# Patient Record
Sex: Male | Born: 1960
Health system: Southern US, Community
[De-identification: ages and names within clinical notes are randomized; demographics above are authoritative.]

## PROBLEM LIST (undated history)

## (undated) DIAGNOSIS — Z860100 Personal history of colon polyps, unspecified: Secondary | ICD-10-CM

## (undated) DIAGNOSIS — R5381 Other malaise: Secondary | ICD-10-CM

## (undated) DIAGNOSIS — F5232 Male orgasmic disorder: Secondary | ICD-10-CM

## (undated) DIAGNOSIS — N529 Male erectile dysfunction, unspecified: Secondary | ICD-10-CM

## (undated) DIAGNOSIS — N200 Calculus of kidney: Secondary | ICD-10-CM

## (undated) DIAGNOSIS — F4001 Agoraphobia with panic disorder: Secondary | ICD-10-CM

## (undated) DIAGNOSIS — M5430 Sciatica, unspecified side: Secondary | ICD-10-CM

## (undated) DIAGNOSIS — R5383 Other fatigue: Secondary | ICD-10-CM

## (undated) HISTORY — DX: Personal history of colon polyps, unspecified: Z86.0100

## (undated) HISTORY — DX: Sciatica, unspecified side: M54.30

## (undated) HISTORY — DX: Calculus of kidney: N20.0

## (undated) HISTORY — PX: LITHOTRIPSY: SUR834

## (undated) HISTORY — DX: Male orgasmic disorder: F52.32

## (undated) HISTORY — DX: Agoraphobia with panic disorder: F40.01

## (undated) HISTORY — DX: Male erectile dysfunction, unspecified: N52.9

## (undated) HISTORY — DX: Other malaise: R53.81

## (undated) HISTORY — DX: Other fatigue: R53.83

## (undated) HISTORY — DX: Personal history of colonic polyps: Z86.010

---

## 2015-05-02 ENCOUNTER — Telehealth: Payer: Self-pay | Admitting: Family Medicine

## 2015-05-02 NOTE — Telephone Encounter (Signed)
Pt wanted to know the name of the doctor that did his colonoscopy 2 years ago.  Please call

## 2015-05-03 NOTE — Telephone Encounter (Signed)
[facility]. Advised.[facility]

## 2015-05-22 ENCOUNTER — Encounter: Payer: Self-pay | Admitting: Family Medicine

## 2015-05-31 ENCOUNTER — Encounter: Payer: Self-pay | Admitting: Family Medicine

## 2015-05-31 ENCOUNTER — Ambulatory Visit (INDEPENDENT_AMBULATORY_CARE_PROVIDER_SITE_OTHER): Payer: BLUE CROSS/BLUE SHIELD | Admitting: Family Medicine

## 2015-05-31 VITALS — BP 136/86 | HR 57 | Temp 97.7°F | Resp 16 | Ht 69.5 in | Wt 194.6 lb

## 2015-05-31 DIAGNOSIS — N529 Male erectile dysfunction, unspecified: Secondary | ICD-10-CM | POA: Insufficient documentation

## 2015-05-31 DIAGNOSIS — E785 Hyperlipidemia, unspecified: Secondary | ICD-10-CM

## 2015-05-31 DIAGNOSIS — F329 Major depressive disorder, single episode, unspecified: Secondary | ICD-10-CM | POA: Diagnosis not present

## 2015-05-31 DIAGNOSIS — Z0000 Encounter for general adult medical examination without abnormal findings: Secondary | ICD-10-CM

## 2015-05-31 DIAGNOSIS — F32A Depression, unspecified: Secondary | ICD-10-CM

## 2015-05-31 DIAGNOSIS — Z7251 High risk heterosexual behavior: Secondary | ICD-10-CM | POA: Diagnosis not present

## 2015-05-31 DIAGNOSIS — K219 Gastro-esophageal reflux disease without esophagitis: Secondary | ICD-10-CM

## 2015-05-31 DIAGNOSIS — R7309 Other abnormal glucose: Secondary | ICD-10-CM

## 2015-05-31 DIAGNOSIS — R739 Hyperglycemia, unspecified: Secondary | ICD-10-CM

## 2015-05-31 DIAGNOSIS — N5201 Erectile dysfunction due to arterial insufficiency: Secondary | ICD-10-CM | POA: Diagnosis not present

## 2015-05-31 DIAGNOSIS — F418 Other specified anxiety disorders: Secondary | ICD-10-CM | POA: Insufficient documentation

## 2015-05-31 DIAGNOSIS — F419 Anxiety disorder, unspecified: Secondary | ICD-10-CM

## 2015-05-31 LAB — CBC WITH DIFFERENTIAL/PLATELET
Basophils Absolute: 0 10*3/uL (ref 0.0–0.2)
Basos: 0 %
EOS (ABSOLUTE): 0.1 10*3/uL (ref 0.0–0.4)
Eos: 1 %
Hematocrit: 47.5 % (ref 37.5–51.0)
Hemoglobin: 16.9 g/dL (ref 12.6–17.7)
Immature Grans (Abs): 0 10*3/uL (ref 0.0–0.1)
Immature Granulocytes: 0 %
Lymphocytes Absolute: 2 10*3/uL (ref 0.7–3.1)
Lymphs: 32 %
MCH: 32.8 pg (ref 26.6–33.0)
MCHC: 35.6 g/dL (ref 31.5–35.7)
MCV: 92 fL (ref 79–97)
Monocytes Absolute: 0.7 10*3/uL (ref 0.1–0.9)
Monocytes: 11 %
Neutrophils Absolute: 3.4 10*3/uL (ref 1.4–7.0)
Neutrophils: 56 %
Platelets: 266 10*3/uL (ref 150–379)
RBC: 5.16 x10E6/uL (ref 4.14–5.80)
RDW: 13.3 % (ref 12.3–15.4)
WBC: 6.2 10*3/uL (ref 3.4–10.8)

## 2015-05-31 NOTE — Progress Notes (Signed)
Name: Alec Hurst   MRN: [id number]    DOB: [birth year]   Date:05/31/2015       Progress Note  Subjective  Chief Complaint  Chief Complaint  Patient presents with  . Annual Exam    pt has no complaints today just annual PE.    HPI  Here for annual physical exam.  Has some anxiety and depression and performance anxiety, all well controlled with meds.  He desires STD testing (risky sexual behavior).  HE is gay.  Has had some elevated lipids in past and borderline sugar.    No problem-specific assessment & plan notes found for this encounter.   Past Medical History  Diagnosis Date  . Impotence, organic   . ED (erectile dysfunction)   . Hx of colonic polyps   . Reflux esophagitis   . GERD (gastroesophageal reflux disease)   . Sciatica   . Panic disorder with agoraphobia   . Malaise and fatigue   . Delayed ejaculation   . Calculus of kidney     Past Surgical History  Procedure Laterality Date  . Lithotripsy      History reviewed. No pertinent family history.  Social History   Social History  . Marital Status: Unknown    Spouse Name: N/A  . Number of Children: N/A  . Years of Education: N/A   Occupational History  . Not on file.   Social History Main Topics  . Smoking status: Never Smoker   . Smokeless tobacco: Never Used  . Alcohol Use: 0.0 oz/week    0 Standard drinks or equivalent per week     Comment: occasional  . Drug Use: No  . Sexual Activity: Not on file   Other Topics Concern  . Not on file   Social History Narrative     Current outpatient prescriptions:  .  baclofen (LIORESAL) 10 MG tablet, Take 10 mg by mouth 3 (three) times daily., Disp: , Rfl:  .  escitalopram (LEXAPRO) 10 MG tablet, Take 10 mg by mouth daily., Disp: , Rfl:  .  meloxicam (MOBIC) 15 MG tablet, Take 15 mg by mouth daily., Disp: , Rfl:  .  omeprazole (PRILOSEC) 10 MG capsule, Take 20 mg by mouth daily as needed. , Disp: , Rfl:  .  propranolol (INDERAL) 10 MG tablet, Take 10  mg by mouth 3 (three) times daily as needed. , Disp: , Rfl:  .  sildenafil (REVATIO) 20 MG tablet, Take 20 mg by mouth 3 (three) times daily., Disp: , Rfl:   Allergies  Allergen Reactions  . Eucalyptus Oil Rash  . Sulfadiazine Rash     Review of Systems  Constitutional: Negative for fever, chills, weight loss and malaise/fatigue.  HENT: Negative for hearing loss.   Eyes: Negative for blurred vision and double vision.  Respiratory: Negative for cough, hemoptysis, sputum production, shortness of breath and wheezing.   Cardiovascular: Negative for chest pain, palpitations, orthopnea and leg swelling.  Gastrointestinal: Negative for heartburn, nausea, vomiting, abdominal pain, diarrhea and blood in stool.  Genitourinary: Negative for dysuria, urgency and frequency.  Musculoskeletal: Negative for myalgias and joint pain.  Skin: Negative for rash.  Neurological: Negative for dizziness, tingling, tremors, sensory change, focal weakness, weakness and headaches.  Psychiatric/Behavioral: Positive for depression (controlled). The patient is nervous/anxious (controlled).        Objective  Filed Vitals:   05/31/15 0917  BP: 136/86  Pulse: 57  Temp: 97.7 F (36.5 C)  TempSrc: Oral  Resp: 16  Height:  5' 9.5" (1.765 m)  Weight: 194 lb 9.6 oz (88.27 kg)    Physical Exam  Constitutional: He is oriented to person, place, and time and well-developed, well-nourished, and in no distress. No distress.  HENT:  Head: Normocephalic and atraumatic.  Right Ear: External ear normal.  Left Ear: External ear normal.  Nose: Nose normal.  Mouth/Throat: Oropharynx is clear and moist.  Eyes: Conjunctivae and EOM are normal. Pupils are equal, round, and reactive to light. No scleral icterus.  Fundoscopic exam:      The right eye shows no arteriolar narrowing, no AV nicking, no hemorrhage and no papilledema.       The left eye shows no arteriolar narrowing, no AV nicking, no hemorrhage and no  papilledema.  Neck: Normal range of motion. Neck supple. Normal carotid pulses present. Carotid bruit is not present. No thyromegaly present.  Cardiovascular: Normal rate, regular rhythm, normal heart sounds and intact distal pulses.  Exam reveals no gallop and no friction rub.   No murmur heard. Pulmonary/Chest: Effort normal and breath sounds normal. No respiratory distress. He has no wheezes. He has no rales.  Abdominal: Soft. Bowel sounds are normal. He exhibits no distension and no mass. There is no tenderness.  Genitourinary: Penis normal. No discharge found.  Musculoskeletal: Normal range of motion. He exhibits no edema or tenderness.  Lymphadenopathy:    He has no cervical adenopathy.  Neurological: He is alert and oriented to person, place, and time. No cranial nerve deficit. Gait normal.  Skin: Skin is warm and dry. No rash noted. No erythema. No pallor.  Psychiatric: Mood, memory, affect and judgment normal.  Vitals reviewed.        No results found for this or any previous visit (from the past 2160 hour(s)).   Assessment & Plan  Problem List Items Addressed This Visit      Digestive   GERD without esophagitis     Genitourinary   ED (erectile dysfunction)     Other   Depression - Primary   Acute anxiety      Meds ordered this encounter  Medications  . sildenafil (REVATIO) 20 MG tablet    Sig: Take 20 mg by mouth 3 (three) times daily.   7.Depression   2. Acute anxiety   3. GERD without esophagitis  - CBC with Differential  4. Erectile dysfunction due to arterial insufficiency   5. Elevated lipids  - Comprehensive Metabolic Panel (CMET) - Lipid Profile  6. Elevated blood sugar  - HgB A1c  1.Annual physical exam   8. Risky sexual behavior  - Hepatitis C Antibody - HIV antibody (with reflex) - RPR

## 2015-05-31 NOTE — Patient Instructions (Signed)
Continue current meds 

## 2015-06-01 ENCOUNTER — Other Ambulatory Visit: Payer: Self-pay | Admitting: Family Medicine

## 2015-06-01 LAB — LIPID PANEL
Chol/HDL Ratio: 4.9 ratio units (ref 0.0–5.0)
Cholesterol, Total: 234 mg/dL — ABNORMAL HIGH (ref 100–199)
HDL: 48 mg/dL (ref >39)
LDL Calculated: 156 mg/dL — ABNORMAL HIGH (ref 0–99)
Triglycerides: 148 mg/dL (ref 0–149)
VLDL Cholesterol Cal: 30 mg/dL (ref 5–40)

## 2015-06-01 LAB — COMPREHENSIVE METABOLIC PANEL
ALT: 52 IU/L — ABNORMAL HIGH (ref 0–44)
AST: 30 IU/L (ref 0–40)
Albumin/Globulin Ratio: 1.8 (ref 1.1–2.5)
Albumin: 4.4 g/dL (ref 3.5–5.5)
Alkaline Phosphatase: 55 IU/L (ref 39–117)
BUN/Creatinine Ratio: 16 (ref 9–20)
BUN: 14 mg/dL (ref 6–24)
Bilirubin Total: 0.5 mg/dL (ref 0.0–1.2)
CO2: 24 mmol/L (ref 18–29)
Calcium: 9.7 mg/dL (ref 8.7–10.2)
Chloride: 102 mmol/L (ref 97–108)
Creatinine, Ser: 0.9 mg/dL (ref 0.76–1.27)
GFR calc Af Amer: 112 mL/min/{1.73_m2} (ref >59)
GFR calc non Af Amer: 97 mL/min/{1.73_m2} (ref >59)
Globulin, Total: 2.4 g/dL (ref 1.5–4.5)
Glucose: 101 mg/dL — ABNORMAL HIGH (ref 65–99)
Potassium: 5 mmol/L (ref 3.5–5.2)
Sodium: 142 mmol/L (ref 134–144)
Total Protein: 6.8 g/dL (ref 6.0–8.5)

## 2015-06-01 LAB — HEMOGLOBIN A1C: Hgb A1c MFr Bld: 5.8 % — ABNORMAL HIGH (ref 4.8–5.6)

## 2015-06-01 LAB — HIV ANTIBODY (ROUTINE TESTING W REFLEX): HIV Screen 4th Generation wRfx: NONREACTIVE

## 2015-06-01 LAB — HEPATITIS C ANTIBODY: Hep C Virus Ab: <0.1 s/co ratio (ref 0.0–0.9)

## 2015-06-01 LAB — RPR: RPR Ser Ql: NONREACTIVE

## 2015-06-01 MED ORDER — ATORVASTATIN CALCIUM 20 MG PO TABS: ORAL_TABLET | ORAL | Status: DC

## 2015-06-29 DIAGNOSIS — K625 Hemorrhage of anus and rectum: Secondary | ICD-10-CM | POA: Insufficient documentation

## 2015-06-29 DIAGNOSIS — Z86010 Personal history of colonic polyps: Secondary | ICD-10-CM | POA: Insufficient documentation

## 2015-08-02 NOTE — Telephone Encounter (Signed)
Error

## 2015-08-17 LAB — HM COLONOSCOPY

## 2015-09-04 ENCOUNTER — Ambulatory Visit: Payer: Self-pay | Admitting: Family Medicine

## 2015-09-21 ENCOUNTER — Telehealth: Payer: Self-pay | Admitting: *Deleted

## 2015-09-21 NOTE — Telephone Encounter (Signed)
R/t call to patient to find out more detail on travel vaccination request. [facility]

## 2015-09-25 ENCOUNTER — Telehealth: Payer: Self-pay | Admitting: Family Medicine

## 2015-09-25 NOTE — Telephone Encounter (Signed)
Pt  Return your  Call

## 2015-09-26 ENCOUNTER — Telehealth: Payer: Self-pay | Admitting: Family Medicine

## 2015-09-26 MED ORDER — HYDROCORTISONE ACETATE 25 MG RE SUPP: 25.0000 mg | Freq: Two times a day (BID) | RECTAL | Status: DC

## 2015-09-26 NOTE — Telephone Encounter (Signed)
Advised patient on travel vaccines at health dept in town. Also requesting refill on Anusol suppository.

## 2015-09-26 NOTE — Telephone Encounter (Signed)
Agree re: travel advice at health dept.  May refill Anusol suppositories, #12, 1 rectally twice a day x 6 days.  1 refill.-jh

## 2015-09-26 NOTE — Telephone Encounter (Signed)
Pt said the Health Dept told him the malaria and typhoid would be pills that could be called to pharmacy and he would also need a prescription of cipro to take with him on trip to [place].  He used [facility] on [address]  His call back number is [phone]

## 2015-09-26 NOTE — Telephone Encounter (Signed)
I am not sure if Typhoid med is required for this region.  Also I have not prescribed this medicine in > 15 yrs and am not sure of current recommendations.  Also the malaria medication is specific for the region that he is going to, and I  do not have the specific recommendations for [place].  I can send him in a prescription for the Cipro.  The health department of [facility] does do travel medicine and  Can give up to date info for his travel.  Sonya, if you,could get him that phone number.  Alternatively we could get him an appt. With Infectious disease at [facility] and they could do his travel recommendations.  Sorry, travel medicine can be quite complicated.

## 2015-09-26 NOTE — Telephone Encounter (Signed)
Have you heard of this being called in? I have always sent them to [facility]

## 2015-09-27 ENCOUNTER — Telehealth: Payer: Self-pay | Admitting: Family Medicine

## 2015-09-27 ENCOUNTER — Other Ambulatory Visit: Payer: Self-pay | Admitting: Family Medicine

## 2015-09-27 MED ORDER — CIPROFLOXACIN HCL 500 MG PO TABS: 500.0000 mg | ORAL_TABLET | Freq: Two times a day (BID) | ORAL | Status: DC

## 2015-09-27 NOTE — Telephone Encounter (Signed)
Cipro sent.-jh

## 2015-09-27 NOTE — Telephone Encounter (Signed)
Pt called again about having a prescription for oral typhoid and malaria set to [facility] on [address].  He also requested a prescription for Hep A shot.  His call back is [phone]

## 2015-09-27 NOTE — Telephone Encounter (Signed)
Patient made aware our office does not do travel meds. He needs to contact local health dept.

## 2015-09-27 NOTE — Telephone Encounter (Signed)
Patient given information re: travel vaccine. He says please sent Cipro rx to his pharmacy.

## 2016-01-22 DIAGNOSIS — Z125 Encounter for screening for malignant neoplasm of prostate: Secondary | ICD-10-CM | POA: Diagnosis not present

## 2016-01-22 DIAGNOSIS — N200 Calculus of kidney: Secondary | ICD-10-CM | POA: Diagnosis not present

## 2016-01-22 DIAGNOSIS — Z87448 Personal history of other diseases of urinary system: Secondary | ICD-10-CM | POA: Diagnosis not present

## 2016-01-22 DIAGNOSIS — N5203 Combined arterial insufficiency and corporo-venous occlusive erectile dysfunction: Secondary | ICD-10-CM | POA: Diagnosis not present

## 2016-02-08 ENCOUNTER — Other Ambulatory Visit: Payer: Self-pay | Admitting: Family Medicine

## 2016-02-08 MED ORDER — ESCITALOPRAM OXALATE 10 MG PO TABS: 10.0000 mg | ORAL_TABLET | Freq: Every day | ORAL | Status: DC

## 2016-02-13 ENCOUNTER — Other Ambulatory Visit: Payer: Self-pay | Admitting: Family Medicine

## 2016-02-13 MED ORDER — PROPRANOLOL HCL 10 MG PO TABS
10.0000 mg | ORAL_TABLET | Freq: Three times a day (TID) | ORAL | Status: DC | PRN
Start: 2016-02-13 — End: 2017-05-28

## 2016-05-20 ENCOUNTER — Ambulatory Visit (INDEPENDENT_AMBULATORY_CARE_PROVIDER_SITE_OTHER): Payer: BLUE CROSS/BLUE SHIELD | Admitting: Family Medicine

## 2016-05-20 ENCOUNTER — Other Ambulatory Visit: Payer: Self-pay | Admitting: Family Medicine

## 2016-05-20 ENCOUNTER — Encounter: Payer: Self-pay | Admitting: Family Medicine

## 2016-05-20 VITALS — BP 144/89 | HR 57 | Temp 98.5°F | Ht 69.5 in | Wt 200.5 lb

## 2016-05-20 DIAGNOSIS — F32A Depression, unspecified: Secondary | ICD-10-CM

## 2016-05-20 DIAGNOSIS — Z86010 Personal history of colonic polyps: Secondary | ICD-10-CM

## 2016-05-20 DIAGNOSIS — F419 Anxiety disorder, unspecified: Secondary | ICD-10-CM

## 2016-05-20 DIAGNOSIS — Z8719 Personal history of other diseases of the digestive system: Secondary | ICD-10-CM

## 2016-05-20 DIAGNOSIS — M109 Gout, unspecified: Secondary | ICD-10-CM | POA: Insufficient documentation

## 2016-05-20 DIAGNOSIS — F329 Major depressive disorder, single episode, unspecified: Secondary | ICD-10-CM

## 2016-05-20 DIAGNOSIS — M1000 Idiopathic gout, unspecified site: Secondary | ICD-10-CM

## 2016-05-20 DIAGNOSIS — R739 Hyperglycemia, unspecified: Secondary | ICD-10-CM | POA: Diagnosis not present

## 2016-05-20 DIAGNOSIS — K219 Gastro-esophageal reflux disease without esophagitis: Secondary | ICD-10-CM

## 2016-05-20 DIAGNOSIS — Z0000 Encounter for general adult medical examination without abnormal findings: Secondary | ICD-10-CM

## 2016-05-20 DIAGNOSIS — Z7251 High risk heterosexual behavior: Secondary | ICD-10-CM

## 2016-05-20 LAB — CBC WITH DIFFERENTIAL/PLATELET
Basophils Absolute: 0 cells/uL (ref 0–200)
Basophils Relative: 0 %
Eosinophils Absolute: 68 cells/uL (ref 15–500)
Eosinophils Relative: 1 %
HCT: 48.3 % (ref 38.5–50.0)
Hemoglobin: 16.9 g/dL (ref 13.2–17.1)
Lymphocytes Relative: 31 %
Lymphs Abs: 2108 cells/uL (ref 850–3900)
MCH: 33.4 pg — ABNORMAL HIGH (ref 27.0–33.0)
MCHC: 35 g/dL (ref 32.0–36.0)
MCV: 95.5 fL (ref 80.0–100.0)
MPV: 10.6 fL (ref 7.5–12.5)
Monocytes Absolute: 680 cells/uL (ref 200–950)
Monocytes Relative: 10 %
Neutro Abs: 3944 cells/uL (ref 1500–7800)
Neutrophils Relative %: 58 %
Platelets: 309 10*3/uL (ref 140–400)
RBC: 5.06 MIL/uL (ref 4.20–5.80)
RDW: 13.2 % (ref 11.0–15.0)
WBC: 6.8 10*3/uL (ref 3.8–10.8)

## 2016-05-20 MED ORDER — COLCHICINE 0.6 MG PO TABS: ORAL_TABLET | ORAL | 12 refills | Status: DC

## 2016-05-20 MED ORDER — ESCITALOPRAM OXALATE 5 MG PO TABS: 5.0000 mg | ORAL_TABLET | Freq: Every day | ORAL | 3 refills | Status: DC

## 2016-05-20 NOTE — Progress Notes (Signed)
Name: Alec Hurst   MRN: [id number]    DOB: 10-02-1961   Date:05/20/2016       Progress Note  Subjective  Chief Complaint  Chief Complaint  Patient presents with  . Annual Exam    HPI Here for complete annual physical exam.  Concerned re: STDs.  He has had colonoscopy and had 2 benign polyps.  To have another in 5 years.  He has eye lesion that is to be biopsied in 21 days.  He has had 3 gout flairs in R great toe over past 3 months.  Treated with NSAIDs but slow to resolve.  No problem-specific Assessment & Plan notes found for this encounter.   Past Medical History:  Diagnosis Date  . Calculus of kidney   . Delayed ejaculation   . ED (erectile dysfunction)   . GERD (gastroesophageal reflux disease)   . Hx of colonic polyps   . Impotence, organic   . Malaise and fatigue   . Panic disorder with agoraphobia   . Reflux esophagitis   . Sciatica     Past Surgical History:  Procedure Laterality Date  . LITHOTRIPSY      Family History  Problem Relation Age of Onset  . Colon cancer Maternal Aunt     Social History   Social History  . Marital status: Unknown    Spouse name: N/A  . Number of children: N/A  . Years of education: N/A   Occupational History  . Not on file.   Social History Main Topics  . Smoking status: Never Smoker  . Smokeless tobacco: Never Used  . Alcohol use 0.0 oz/week     Comment: occasional  . Drug use: No  . Sexual activity: Not on file   Other Topics Concern  . Not on file   Social History Narrative  . No narrative on file     Current Outpatient Prescriptions:  .  baclofen (LIORESAL) 10 MG tablet, Take 10 mg by mouth 3 (three) times daily as needed. , Disp: , Rfl:  .  ciprofloxacin (CIPRO) 500 MG tablet, Take 1 tablet (500 mg total) by mouth 2 (two) times daily. (Patient taking differently: Take 500 mg by mouth 2 (two) times daily as needed. ), Disp: 20 tablet, Rfl: 0 .  escitalopram (LEXAPRO) 5 MG tablet, Take 1 tablet (5 mg  total) by mouth daily., Disp: 90 tablet, Rfl: 3 .  hydrocortisone (ANUSOL-HC) 25 MG suppository, Place 1 suppository (25 mg total) rectally 2 (two) times daily., Disp: 12 suppository, Rfl: 1 .  meloxicam (MOBIC) 15 MG tablet, Take 15 mg by mouth daily as needed. , Disp: , Rfl:  .  omeprazole (PRILOSEC) 10 MG capsule, Take 20 mg by mouth daily as needed. , Disp: , Rfl:  .  propranolol (INDERAL) 10 MG tablet, Take 1 tablet (10 mg total) by mouth 3 (three) times daily as needed., Disp: 270 tablet, Rfl: 3 .  sildenafil (REVATIO) 20 MG tablet, Take 20 mg by mouth 3 (three) times daily., Disp: , Rfl:  .  colchicine 0.6 MG tablet, Take 2 tablets stat at beginning of gout flair, 1, 2 hours later, then 1 twice a day until flair resolves., Disp: 60 tablet, Rfl: 12  Allergies  Allergen Reactions  . Eucalyptus Oil Rash  . Sulfadiazine Rash     Review of Systems  Constitutional: Negative for chills, fever, malaise/fatigue and weight loss.  HENT: Negative for hearing loss.   Eyes: Negative for blurred vision and double vision.  Respiratory: Negative for cough, shortness of breath and wheezing.   Cardiovascular: Negative for chest pain, palpitations and leg swelling.  Gastrointestinal: Negative for abdominal pain, blood in stool and heartburn.  Genitourinary: Negative for dysuria, frequency and urgency.  Musculoskeletal: Positive for joint pain (gout flairs). Negative for back pain and myalgias.  Skin: Negative for rash.  Neurological: Negative for dizziness, tremors, weakness and headaches.      Objective  Vitals:   05/20/16 0908  BP: (!) 144/89  Pulse: (!) 57  Temp: 98.5 F (36.9 C)  TempSrc: Oral  Weight: 200 lb 8 oz (90.9 kg)  Height: 5' 9.5" (1.765 m)    Physical Exam  Constitutional: He is oriented to person, place, and time and well-developed, well-nourished, and in no distress. No distress.  HENT:  Head: Normocephalic and atraumatic.  Eyes: Conjunctivae and EOM are normal.  Pupils are equal, round, and reactive to light. No scleral icterus.  Neck: Normal range of motion. Neck supple. Carotid bruit is not present. No thyromegaly present.  Cardiovascular: Normal rate, regular rhythm and normal heart sounds.  Exam reveals no gallop and no friction rub.   No murmur heard. Pulmonary/Chest: Effort normal and breath sounds normal. No respiratory distress. He has no wheezes. He has no rales.  Abdominal: Bowel sounds are normal. He exhibits no distension and no mass. There is no tenderness.  Musculoskeletal: He exhibits no edema.  Lymphadenopathy:    He has no cervical adenopathy.  Neurological: He is alert and oriented to person, place, and time.  Skin: Skin is warm and dry.  Psychiatric: Mood, memory, affect and judgment normal.  Vitals reviewed.      No results found for this or any previous visit (from the past 2160 hour(s)).   Assessment & Plan  Problem List Items Addressed This Visit      Digestive   GERD without esophagitis     Other   Depression   Relevant Medications   escitalopram (LEXAPRO) 5 MG tablet   Performance anxiety - Primary   Relevant Medications   escitalopram (LEXAPRO) 5 MG tablet   Hx of adenomatous colonic polyps   Hx of gastroesophageal reflux (GERD)   Relevant Orders   CBC with Differential   Gout   Relevant Medications   colchicine 0.6 MG tablet   Other Relevant Orders   COMPLETE METABOLIC PANEL WITH GFR   Uric acid   Risky sexual behavior   Relevant Orders   HIV antibody (with reflex)   RPR   Hepatitis C Antibody   Health maintenance examination   Relevant Orders   Lipid Profile   PSA    Other Visit Diagnoses   None.     Meds ordered this encounter  Medications  . escitalopram (LEXAPRO) 5 MG tablet    Sig: Take 1 tablet (5 mg total) by mouth daily.    Dispense:  90 tablet    Refill:  3  . colchicine 0.6 MG tablet    Sig: Take 2 tablets stat at beginning of gout flair, 1, 2 hours later, then 1 twice a  day until flair resolves.    Dispense:  60 tablet    Refill:  12   1. Acute anxiety  - escitalopram (LEXAPRO) 5 MG tablet; Take 1 tablet (5 mg total) by mouth daily.  Dispense: 90 tablet; Refill: 3  2. Hx of gastroesophageal reflux (GERD)  - CBC with Differential  3. Depression   4. Hx of adenomatous colonic polyps   5.  Idiopathic gout, unspecified chronicity, unspecified site  - COMPLETE METABOLIC PANEL WITH GFR - Uric acid - colchicine 0.6 MG tablet; Take 2 tablets stat at beginning of gout flair, 1, 2 hours later, then 1 twice a day until flair resolves.  Dispense: 60 tablet; Refill: 12  6. Risky sexual behavior  - HIV antibody (with reflex) - RPR - Hepatitis C Antibody  7. GERD without esophagitis   8. Health maintenance examination  - Lipid Profile - PSA  Continue all routine medications

## 2016-05-21 LAB — COMPLETE METABOLIC PANEL WITH GFR
ALT: 58 U/L — ABNORMAL HIGH (ref 9–46)
AST: 35 U/L (ref 10–35)
Albumin: 4.3 g/dL (ref 3.6–5.1)
Alkaline Phosphatase: 54 U/L (ref 40–115)
BUN: 11 mg/dL (ref 7–25)
CO2: 25 mmol/L (ref 20–31)
Calcium: 9.6 mg/dL (ref 8.6–10.3)
Chloride: 104 mmol/L (ref 98–110)
Creat: 0.96 mg/dL (ref 0.70–1.33)
GFR, Est African American: >89 mL/min (ref >=60)
GFR, Est Non African American: >89 mL/min (ref >=60)
Glucose, Bld: 107 mg/dL — ABNORMAL HIGH (ref 65–99)
Potassium: 5 mmol/L (ref 3.5–5.3)
Sodium: 140 mmol/L (ref 135–146)
Total Bilirubin: 0.5 mg/dL (ref 0.2–1.2)
Total Protein: 6.4 g/dL (ref 6.1–8.1)

## 2016-05-21 LAB — RPR

## 2016-05-21 LAB — HEMOGLOBIN A1C
Hgb A1c MFr Bld: 5.8 % — ABNORMAL HIGH (ref <5.7)
Mean Plasma Glucose: 120 mg/dL

## 2016-05-21 LAB — HIV ANTIBODY (ROUTINE TESTING W REFLEX): HIV 1&2 Ab, 4th Generation: NONREACTIVE

## 2016-05-21 LAB — LIPID PANEL
Cholesterol: 223 mg/dL — ABNORMAL HIGH (ref 125–200)
HDL: 45 mg/dL (ref >=40)
LDL Cholesterol: 153 mg/dL — ABNORMAL HIGH (ref <130)
Total CHOL/HDL Ratio: 5 Ratio (ref <=5.0)
Triglycerides: 125 mg/dL (ref <150)
VLDL: 25 mg/dL (ref <30)

## 2016-05-21 LAB — PSA: PSA: 0.69 ng/mL (ref <=4.00)

## 2016-05-21 LAB — HEPATITIS C ANTIBODY: HCV Ab: NEGATIVE

## 2016-05-21 LAB — URIC ACID: Uric Acid, Serum: 8.1 mg/dL — ABNORMAL HIGH (ref 4.0–8.0)

## 2016-05-22 DIAGNOSIS — D3132 Benign neoplasm of left choroid: Secondary | ICD-10-CM | POA: Diagnosis not present

## 2016-07-23 ENCOUNTER — Other Ambulatory Visit: Payer: Self-pay | Admitting: Family Medicine

## 2016-08-26 ENCOUNTER — Ambulatory Visit: Payer: BLUE CROSS/BLUE SHIELD | Admitting: Family Medicine

## 2016-10-12 DIAGNOSIS — I10 Essential (primary) hypertension: Secondary | ICD-10-CM | POA: Diagnosis not present

## 2016-10-12 DIAGNOSIS — F17200 Nicotine dependence, unspecified, uncomplicated: Secondary | ICD-10-CM | POA: Diagnosis not present

## 2016-10-12 DIAGNOSIS — J329 Chronic sinusitis, unspecified: Secondary | ICD-10-CM | POA: Diagnosis not present

## 2016-10-12 DIAGNOSIS — B309 Viral conjunctivitis, unspecified: Secondary | ICD-10-CM | POA: Diagnosis not present

## 2016-10-12 DIAGNOSIS — R070 Pain in throat: Secondary | ICD-10-CM | POA: Diagnosis not present

## 2016-10-12 DIAGNOSIS — B9789 Other viral agents as the cause of diseases classified elsewhere: Secondary | ICD-10-CM | POA: Diagnosis not present

## 2016-10-12 DIAGNOSIS — J00 Acute nasopharyngitis [common cold]: Secondary | ICD-10-CM | POA: Diagnosis not present

## 2016-10-12 DIAGNOSIS — R52 Pain, unspecified: Secondary | ICD-10-CM | POA: Diagnosis not present

## 2016-10-12 DIAGNOSIS — J069 Acute upper respiratory infection, unspecified: Secondary | ICD-10-CM | POA: Diagnosis not present

## 2016-11-19 DIAGNOSIS — L218 Other seborrheic dermatitis: Secondary | ICD-10-CM | POA: Diagnosis not present

## 2016-11-19 DIAGNOSIS — L578 Other skin changes due to chronic exposure to nonionizing radiation: Secondary | ICD-10-CM | POA: Diagnosis not present

## 2016-11-19 DIAGNOSIS — L111 Transient acantholytic dermatosis [Grover]: Secondary | ICD-10-CM | POA: Diagnosis not present

## 2017-01-27 DIAGNOSIS — Z125 Encounter for screening for malignant neoplasm of prostate: Secondary | ICD-10-CM | POA: Diagnosis not present

## 2017-01-27 DIAGNOSIS — Z87442 Personal history of urinary calculi: Secondary | ICD-10-CM | POA: Diagnosis not present

## 2017-01-27 DIAGNOSIS — N200 Calculus of kidney: Secondary | ICD-10-CM | POA: Diagnosis not present

## 2017-05-26 ENCOUNTER — Encounter: Payer: BLUE CROSS/BLUE SHIELD | Admitting: Family Medicine

## 2017-05-28 ENCOUNTER — Encounter: Payer: Self-pay | Admitting: Family Medicine

## 2017-05-28 ENCOUNTER — Ambulatory Visit (INDEPENDENT_AMBULATORY_CARE_PROVIDER_SITE_OTHER): Payer: BLUE CROSS/BLUE SHIELD | Admitting: Family Medicine

## 2017-05-28 VITALS — BP 138/82 | HR 54 | Temp 98.3°F | Resp 16 | Ht 69.5 in | Wt 201.2 lb

## 2017-05-28 DIAGNOSIS — Z0000 Encounter for general adult medical examination without abnormal findings: Secondary | ICD-10-CM | POA: Diagnosis not present

## 2017-05-28 DIAGNOSIS — F410 Panic disorder [episodic paroxysmal anxiety] without agoraphobia: Secondary | ICD-10-CM

## 2017-05-28 DIAGNOSIS — R7303 Prediabetes: Secondary | ICD-10-CM

## 2017-05-28 DIAGNOSIS — F418 Other specified anxiety disorders: Secondary | ICD-10-CM | POA: Diagnosis not present

## 2017-05-28 DIAGNOSIS — G8929 Other chronic pain: Secondary | ICD-10-CM

## 2017-05-28 DIAGNOSIS — Z7251 High risk heterosexual behavior: Secondary | ICD-10-CM

## 2017-05-28 DIAGNOSIS — E782 Mixed hyperlipidemia: Secondary | ICD-10-CM | POA: Insufficient documentation

## 2017-05-28 DIAGNOSIS — F411 Generalized anxiety disorder: Secondary | ICD-10-CM

## 2017-05-28 DIAGNOSIS — E1169 Type 2 diabetes mellitus with other specified complication: Secondary | ICD-10-CM | POA: Insufficient documentation

## 2017-05-28 DIAGNOSIS — Z125 Encounter for screening for malignant neoplasm of prostate: Secondary | ICD-10-CM

## 2017-05-28 DIAGNOSIS — M5442 Lumbago with sciatica, left side: Secondary | ICD-10-CM

## 2017-05-28 DIAGNOSIS — K219 Gastro-esophageal reflux disease without esophagitis: Secondary | ICD-10-CM | POA: Diagnosis not present

## 2017-05-28 DIAGNOSIS — E785 Hyperlipidemia, unspecified: Secondary | ICD-10-CM | POA: Insufficient documentation

## 2017-05-28 LAB — CBC WITH DIFFERENTIAL/PLATELET
Basophils Absolute: 60 cells/uL (ref 0–200)
Basophils Relative: 1 %
Eosinophils Absolute: 60 cells/uL (ref 15–500)
Eosinophils Relative: 1 %
HCT: 50.6 % — ABNORMAL HIGH (ref 38.5–50.0)
Hemoglobin: 17.2 g/dL — ABNORMAL HIGH (ref 13.2–17.1)
Lymphocytes Relative: 29 %
Lymphs Abs: 1740 cells/uL (ref 850–3900)
MCH: 33.1 pg — ABNORMAL HIGH (ref 27.0–33.0)
MCHC: 34 g/dL (ref 32.0–36.0)
MCV: 97.5 fL (ref 80.0–100.0)
MPV: 10.6 fL (ref 7.5–12.5)
Monocytes Absolute: 600 cells/uL (ref 200–950)
Monocytes Relative: 10 %
Neutro Abs: 3540 cells/uL (ref 1500–7800)
Neutrophils Relative %: 59 %
Platelets: 301 10*3/uL (ref 140–400)
RBC: 5.19 MIL/uL (ref 4.20–5.80)
RDW: 13.4 % (ref 11.0–15.0)
WBC: 6 10*3/uL (ref 3.8–10.8)

## 2017-05-28 MED ORDER — BACLOFEN 10 MG PO TABS: 10.0000 mg | ORAL_TABLET | Freq: Three times a day (TID) | ORAL | 2 refills | Status: DC | PRN

## 2017-05-28 MED ORDER — OMEPRAZOLE 20 MG PO CPDR: 20.0000 mg | DELAYED_RELEASE_CAPSULE | Freq: Every day | ORAL | 11 refills | Status: DC | PRN

## 2017-05-28 MED ORDER — PROPRANOLOL HCL 10 MG PO TABS: 10.0000 mg | ORAL_TABLET | Freq: Three times a day (TID) | ORAL | 3 refills | Status: DC | PRN

## 2017-05-28 NOTE — Assessment & Plan Note (Signed)
Homosexual male patient with male partner, unprotected intercourse, does not have multiple partners - He has anxiety over potential STDs and requests frequent testing, prior tests negative - Check HIV, Hepatitis Panel and RPR today by patient request, also will check Hep B vaccine status he is unsure if completed vaccine

## 2017-05-28 NOTE — Patient Instructions (Addendum)
Thank you for coming to the clinic today.  1.  Recommend to start taking Tylenol Extra Strength 500mg  tabs - take 1 to 2 tabs per dose (max 1000mg ) every 6-8 hours for pain (take regularly, don't skip a dose for next 7 days), max 24 hour daily dose is 6 tablets or 3000mg . In the future you can repeat the same everyday Tylenol course for 1-2 weeks at a time.   Start taking Baclofen (Lioresal) 10mg  (muscle relaxant) - start with half (cut) to one whole pill at night as needed for next 1-3 nights (may make you drowsy, caution with driving) see how it affects you, then if tolerated increase to one pill 2 to 3 times a day or (every 8 hours as needed)  2. For anxiety - Consider one other alternative with Buspar (Buspirone) for anxiety / Wellbutrin (Buproprion) for Depression Call if you are needing any extra help to discuss these meds or if you are ready to try one if not improving  Please schedule a Follow-up Appointment to: Return in about 1 year (around 05/28/2018) for Annual Physical.  If you have any other questions or concerns, please feel free to call the clinic or send a message through [facility]. You may also schedule an earlier appointment if necessary.  Additionally, you may be receiving a survey about your experience at our clinic within a few days to 1 week by e-mail or mail. We value your feedback.  Nobie Putnam, DO [place]

## 2017-05-28 NOTE — Assessment & Plan Note (Addendum)
Consistent with chronic GAD with panic also has some mixed depressive symptoms, multiple triggers, often anxiety related to performance (musician). Also anxiety with waiting on test results. Suspected mood/insomnia is secondary to anxiety -GAD7: 18, somewhat difficult / PHQ9: 13 somewhat - Failed SSRI due to sexual dysfunction - No prior Psych / counseling  Plan: 1. Discussion on management of anxiety - suspect he would benefit from other med management anxiety/mood - offered non SSRI options such as Buspar for anxiety and Wellbutrin for mood, but he is not ready to start new med today, will consider and can notify office 2. Continue current Propanolol 10mg  TID - seems to be stable on BB also helps any tremor or performance anxiety 3. Advised recommend therapy / counseling in future - declined today 4. Follow-up as needed if not improved anxiety, med adjust, GAD7/PHQ9

## 2017-05-28 NOTE — Assessment & Plan Note (Addendum)
Suspected chronic GERD, controlled on intermittent PPI - No GI red flag symptoms - History not suggestive of PUD - Agree with plan to use intermittent consider taper if need Refilled Omeprazole 20mg  daily PRN

## 2017-05-28 NOTE — Progress Notes (Signed)
Subjective:    Patient ID: Alec Hurst, male    DOB: 1961/02/07, 56 y.o.   MRN: [id number]  Alec Hurst is a 56 y.o. male presenting on 05/28/2017 for Annual Exam  HPI   Here for Annual Physical, patient has not established with me as new provider yet in office. He is due for fasting blood work. He lives in [place] and also receives care from other specialist there, will request records.  Pre-Diabetes Reports no significant concerns, has had "borderline" elevated sugar, with A1c 5.8 in past CBGs: Not checking CBG Meds: Never on meds Currently not on ACEi/ARB Lifestyle: - Diet (balanced diet, no particular diet plan)  - Exercise (Used to be more active with gym then had back problems and limited exercise, now recent flare up of back again)  History of Chronic Back Pain / Lumbar OA/DJD and history of Sciatica L side - Reviews prior history of Back problems with intermittent flares with underlying OA/DJD, has had episodes of L sciatica before, he was followed by Ortho in [place], received PT and dry needling (w/o relief), exercises, other treatment, and eventually cortisone shot which improved symptoms, he was to avoid high impact exercises, and he did better. Now recent flare up again over past few months, Low back and L side sciatica intermittent, he has started going to Chiropractor with partner, and will have x-rays soon - Denies active back pain or sciatica today  GERD - Reports chronic history of GERD, has been controlled on PPI with good results, not taking daily, but requesting refill  HYPERLIPIDEMIA: - Reports no concerns he states was offered statin medicine in past but he declined, not interested right now. Last lipid panel 04/2016, abnormal LDL and TC - he lost weight before and helped his labs but then has gradually gained wt back due to back problems  High Risk Sexual Behavior / MSM - He reports that he is homosexual and prefers male sex partners, has one male partner  currently, unprotected anal intercourse without condoms. No known STD exposure, but he often gets tested here yearly for HIV and RPR among other concerns including Hep C by his preference. He is also asking about PreP in future if he decides to try this. Admits his anxiety is worse if he is waiting on lab test results. Reviewed prior labs from 04/2016 negative.  History of Nephrolithiasis: - Reports prior history of kidney stone, and followed [place]rologist in [place], will send [place] copy of record. They have checked DRE there was told normal prostate. He is unsure if he has had PSA checked. No known family history of prostate cancer. 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 Also recent life stressors 2 deaths in family recently in past 12 month, aunt passed, increased his anxiety. - he is taking Propanolol now with good results, and would consider other meds, but he does not want to try meds that would cause any sexual dysfunction, has not been on Wellbutrin or Buspar before.  Health Maintenance: - History of prior colon polyps up to 13, precancerous followed by [facility] > but lives in [place] and plans to return there for GI, last colonoscopy 2017, q 2-3 years, last with 2 polyps, benign - Due for Flu Shot in  Fall 2018  Depression screen [facility] 2/9 05/28/2017 05/20/2016 05/31/2015  Decreased Interest 3 0 0  Down, Depressed, Hopeless - 0 0  PHQ - 2 Score 3 0 0  Altered sleeping 3 - -  Tired, decreased energy 3 - -  Change in appetite 2 - -  Feeling bad or failure about yourself  2 - -  Trouble concentrating 0 - -  Moving slowly or fidgety/restless 0 - -  Suicidal thoughts 0 - -  PHQ-9 Score 13 - -  Difficult doing work/chores Somewhat difficult - -    GAD 7 : Generalized Anxiety Score 05/28/2017  Nervous, Anxious, on Edge 3  Control/stop worrying 3  Worry too much - different things 3  Trouble relaxing 3  Restless 0  Easily annoyed or irritable 3  Afraid - awful might happen 3  Total GAD 7 Score 18  Anxiety Difficulty Somewhat difficult    Past Medical History:  Diagnosis Date  . Calculus of kidney   . Delayed ejaculation   . ED (erectile dysfunction)   . GERD (gastroesophageal reflux disease)   . Hx of colonic polyps   . Impotence, organic   . Malaise and fatigue   . Panic disorder with agoraphobia   . Reflux esophagitis   . Sciatica    Past Surgical History:  Procedure Laterality Date  . LITHOTRIPSY     Social History   Social History  . Marital status: Soil scientist    Spouse name: N/A  . Number of children: N/A  . Years of education: N/A   Occupational History  . Musician Copywriter, advertising, Photographer)    Social History Main Topics  . Smoking status: Never Smoker  . Smokeless tobacco: Never Used  . Alcohol use 0.0 oz/week     Comment: occasional  . Drug use: No  . Sexual activity: Not on file   Other Topics Concern  . Not on file   Social History Narrative  . No narrative on file   Family History  Problem Relation Age of Onset  . Colon cancer Maternal Aunt   . Other Mother        rx med addiction before passing   Current Outpatient Prescriptions on File Prior to Visit  Medication Sig  . colchicine 0.6 MG tablet Take 2 tablets stat at beginning of gout flair, 1, 2 hours later, then 1 twice a day until flair resolves.  . meloxicam (MOBIC) 15 MG tablet Take 15 mg by mouth daily as needed.   . sildenafil (REVATIO) 20 MG tablet Take 20 mg by mouth 3 (three) times daily.   No current facility-administered medications on file prior to visit.     Review of Systems  Constitutional: Negative for activity change, appetite change, chills, diaphoresis, fatigue, fever and unexpected weight change.   HENT: Negative for congestion, hearing loss and sinus pressure.   Eyes: Negative for visual disturbance.  Respiratory: Negative for apnea, cough, chest tightness, shortness of breath and wheezing.   Cardiovascular: Negative for chest pain, palpitations and leg swelling.  Gastrointestinal: Negative for abdominal distention, abdominal pain, anal bleeding, blood in stool, constipation, diarrhea, nausea and vomiting.  Endocrine: Negative for cold intolerance and polyuria.  Genitourinary: Negative for decreased urine volume, difficulty urinating, dysuria, frequency and hematuria.  Musculoskeletal: Positive for back pain (L sided intermittent, none today). Negative for arthralgias and neck pain.  Skin: Negative for rash.  Allergic/Immunologic: Negative for environmental allergies.  Neurological: Negative for dizziness, weakness, light-headedness, numbness and headaches.  Hematological: Negative for adenopathy.  Psychiatric/Behavioral: Negative for agitation, behavioral problems, decreased concentration, dysphoric mood, self-injury, sleep disturbance and suicidal ideas. The patient is nervous/anxious.    Per HPI unless specifically indicated above     Objective:    BP 138/82   Pulse (!) 54   Temp 98.3 F (36.8 C) (Oral)   Resp 16   Ht 5' 9.5" (1.765 m)   Wt 201 lb 3.2 oz (91.3 kg)   BMI 29.29 kg/m   Wt Readings from Last 3 Encounters:  05/28/17 201 lb 3.2 oz (91.3 kg)  05/20/16 200 lb 8 oz (90.9 kg)  05/31/15 194 lb 9.6 oz (88.3 kg)    Physical Exam  Constitutional: He is oriented to person, place, and time. He appears well-developed and well-nourished. No distress.  Well-appearing, comfortable, cooperative  HENT:  Head: Normocephalic and atraumatic.  Mouth/Throat: Oropharynx is clear and moist.  Eyes: Pupils are equal, round, and reactive to light. Conjunctivae and EOM are normal. Right eye exhibits no discharge. Left eye exhibits no discharge.  Neck: Normal range of motion. Neck  supple. No thyromegaly present.  No carotid bruits  Cardiovascular: Normal rate, regular rhythm, normal heart sounds and intact distal pulses.   No murmur heard. Pulmonary/Chest: Effort normal and breath sounds normal. No respiratory distress. He has no wheezes. He has no rales.  Abdominal: Soft. Bowel sounds are normal. He exhibits no distension and no mass. There is no tenderness.  Genitourinary:  Genitourinary Comments: Deferred DRE  Musculoskeletal: Normal range of motion. He exhibits no edema or tenderness.  Upper / Lower Extremities: - Normal muscle tone, strength bilateral upper extremities 5/5, lower extremities 5/5  Low Back Inspection: Normal appearance, no spinal deformity, symmetrical. Palpation: No tenderness over spinous processes. Bilateral lumbar paraspinal muscles non-tender and without hypertonicity/spasm. ROM: Full active ROM forward flex / back extension, rotation L/R without discomfort Special Testing: Seated SLR negative for radicular pain bilaterally, mild Left leg tightness but not pain Strength: Bilateral hip flex/ext 5/5, knee flex/ext 5/5, ankle dorsiflex/plantarflex 5/5 Neurovascular: intact distal sensation to light touch  Lymphadenopathy:    He has no cervical adenopathy.  Neurological: He is alert and oriented to person, place, and time.  Distal sensation intact to light touch all extremities  Skin: Skin is warm and dry. No rash noted. He is not diaphoretic. No erythema.  Psychiatric: He has a normal mood and affect. His behavior is normal.  Well groomed, good eye contact, normal speech and thoughts. Mildly anxious appearing but overall comfortable.  Nursing note and vitals reviewed.    Results for orders placed or performed in visit on 05/28/17  COMPLETE METABOLIC PANEL WITH GFR  Result Value Ref Range   Sodium  135 - 146 mmol/L   Potassium  3.5 - 5.3 mmol/L   Chloride  98 - 110 mmol/L   CO2  20 - 32 mmol/L   Glucose, Bld  65 - 99 mg/dL   BUN  7 -  25 mg/dL   Creat  0.70 - 1.33 mg/dL   Total Bilirubin  0.2 - 1.2 mg/dL   Alkaline Phosphatase  40 - 115 U/L   AST  10 - 35 U/L   ALT  9 - 46 U/L   Total Protein  6.1 - 8.1 g/dL   Albumin  3.6 - 5.1 g/dL   Calcium  8.6 - 10.3 mg/dL   GFR, Est African American  >=60 mL/min   GFR, Est Non African American  >=60 mL/min  Lipid  panel  Result Value Ref Range   Cholesterol  <200 mg/dL   Triglycerides  <150 mg/dL   HDL  mg/dL   Total CHOL/HDL Ratio  <5.0 Ratio   VLDL  <30 mg/dL   LDL Cholesterol  <100 mg/dL  Hemoglobin A1c  Result Value Ref Range   Hgb A1c MFr Bld  <5.7 %   Mean Plasma Glucose  mg/dL  CBC with Differential/Platelet  Result Value Ref Range   WBC 6.0 3.8 - 10.8 K/uL   RBC 5.19 4.20 - 5.80 MIL/uL   Hemoglobin 17.2 (H) 13.2 - 17.1 g/dL   HCT 50.6 (H) 38.5 - 50.0 %   MCV 97.5 80.0 - 100.0 fL   MCH 33.1 (H) 27.0 - 33.0 pg   MCHC 34.0 32.0 - 36.0 g/dL   RDW 13.4 11.0 - 15.0 %   Platelets 301 140 - 400 K/uL   MPV 10.6 7.5 - 12.5 fL   Neutro Abs 3,540 1,500 - 7,800 cells/uL   Lymphs Abs 1,740 850 - 3,900 cells/uL   Monocytes Absolute 600 200 - 950 cells/uL   Eosinophils Absolute 60 15 - 500 cells/uL   Basophils Absolute 60 0 - 200 cells/uL   Neutrophils Relative % 59 %   Lymphocytes Relative 29 %   Monocytes Relative 10 %   Eosinophils Relative 1 %   Basophils Relative 1 %   Smear Review Criteria for review not met   Acute Hep Panel & Hep B Surface Ab  Result Value Ref Range   Hepatitis B Surface Ag  NON-REACTIVE   Hep B C IgM  NON-REACTIVE   Hep B S Ab  NON-REACTIVE   Hep A IgM  NON-REACTIVE   HCV Ab  NON-REACTIVE  RPR  Result Value Ref Range   RPR Ser Ql  NON REAC  HIV antibody  Result Value Ref Range   HIV 1&2 Ab, 4th Generation  NONREACTIVE  PSA, Total with Reflex to PSA, Free  Result Value Ref Range   PSA, Total  ng/mL  Hepatitis panel, acute  Result Value Ref Range   Hepatitis B Surface Ag  NON-REACTIVE   HCV Ab  NON-REACTIVE   Hep B C IgM   NON-REACTIVE   Hep A IgM  NON-REACTIVE      Assessment & Plan:   Problem List Items Addressed This Visit    Screening for prostate cancer    Check PSA today Patient has Urologist in [place], has had normal PSA in past, but requests re-check today      Relevant Orders   PSA, Total with Reflex to PSA, Free (Completed)   Pre-diabetes    Well-controlled Pre-DM with A1c 5.8 in past Concern with HLD  Plan:  1. Not on any therapy currently 2. Encourage improved lifestyle - low carb, low sugar diet, reduce portion size, continue improving regular exercise 3. Follow-up q 6-12 months A1c      Relevant Orders   COMPLETE METABOLIC PANEL WITH GFR (Completed)   Hemoglobin A1c (Completed)   Performance anxiety    Significant trigger for his general anxiety, see A&P Controlled on Propanolol      Hyperlipidemia    Uncontrolled cholesterol on lifestyle, some weight gain back Last lipid panel 04/2016 - elevated LDL  Plan: 1. Discussion on ASCVD risk reduction / control LDL - not interested in statin or ASA at this time 2. Check fasting lipid panel - calculated ASCVD risk follow-up 3. Encourage improved lifestyle - low carb/cholesterol, reduce portion size, continue  improving regular exercise 4. Follow-up 1 yr      Relevant Medications   atorvastatin (LIPITOR) 20 MG tablet   propranolol (INDERAL) 10 MG tablet   Other Relevant Orders   Lipid panel (Completed)   High risk sexual behavior    Homosexual male patient with male partner, unprotected intercourse, does not have multiple partners - He has anxiety over potential STDs and requests frequent testing, prior tests negative - Check HIV, Hepatitis Panel and RPR today by patient request, also will check Hep B vaccine status he is unsure if completed vaccine      Relevant Orders   Acute Hep Panel & Hep B Surface Ab (Completed)   RPR (Completed)   HIV antibody (Completed)   GERD without esophagitis    Suspected chronic GERD, controlled  on intermittent PPI - No GI red flag symptoms - History not suggestive of PUD - Agree with plan to use intermittent consider taper if need Refilled Omeprazole 57m daily PRN      Relevant Medications   omeprazole (PRILOSEC) 20 MG capsule   Generalized anxiety disorder with panic attacks    Consistent with chronic GAD with panic also has some mixed depressive symptoms, multiple triggers, often anxiety related to performance (musician). Also anxiety with waiting on test results. Suspected mood/insomnia is secondary to anxiety -GAD7: 18, somewhat difficult / PHQ9: 13 somewhat - Failed SSRI due to sexual dysfunction - No prior Psych / counseling  Plan: 1. Discussion on management of anxiety - suspect he would benefit from other med management anxiety/mood - offered non SSRI options such as Buspar for anxiety and Wellbutrin for mood, but he is not ready to start new med today, will consider and can notify office 2. Continue current Propanolol 124mTID - seems to be stable on BB also helps any tremor or performance anxiety 3. Advised recommend therapy / counseling in future - declined today 4. Follow-up as needed if not improved anxiety, med adjust, GAD7/PHQ9      Relevant Medications   propranolol (INDERAL) 10 MG tablet   Chronic left-sided low back pain with left-sided sciatica    Subacute on chronic L LBP with associated L sciatica. Suspect likely due to muscle spasm/strain, without known injury or trauma. In setting of known chronic LBP with DJD,  - No red flag symptoms. Negative SLR for radiculopathy - Inadequate conservative therapy   Plan: 1. Start regular Tylenol dosing 2. Refilled Baclofen PRN 3. Follow-up with Chiropractor, x-ray images 4. May consider return to [place]o in [place] interested      Relevant Medications   baclofen (LIORESAL) 10 MG tablet    Other Visit Diagnoses    Annual physical exam    -  Primary   Relevant Orders   COMPLETE METABOLIC PANEL WITH GFR  (Completed)   Lipid panel (Completed)   CBC with Differential/Platelet (Completed)      Meds ordered this encounter  Medications  . atorvastatin (LIPITOR) 20 MG tablet    Sig: atorvastatin 20 mg tablet  take 1 tablet by mouth every evening  . propranolol (INDERAL) 10 MG tablet    Sig: Take 1 tablet (10 mg total) by mouth 3 (three) times daily as needed.    Dispense:  270 tablet    Refill:  3  . omeprazole (PRILOSEC) 20 MG capsule    Sig: Take 1 capsule (20 mg total) by mouth daily as needed.    Dispense:  30 capsule    Refill:  11  .  baclofen (LIORESAL) 10 MG tablet    Sig: Take 1 tablet (10 mg total) by mouth 3 (three) times daily as needed.    Dispense:  30 each    Refill:  2     Follow up plan: Return in about 1 year (around 05/28/2018) for Annual Physical.  Nobie Putnam, DO [facility] 05/29/2017, 12:03 AM

## 2017-05-29 DIAGNOSIS — M5442 Lumbago with sciatica, left side: Secondary | ICD-10-CM

## 2017-05-29 DIAGNOSIS — G8929 Other chronic pain: Secondary | ICD-10-CM | POA: Insufficient documentation

## 2017-05-29 DIAGNOSIS — Z125 Encounter for screening for malignant neoplasm of prostate: Secondary | ICD-10-CM | POA: Insufficient documentation

## 2017-05-29 LAB — COMPLETE METABOLIC PANEL WITH GFR
ALT: 55 U/L — ABNORMAL HIGH (ref 9–46)
AST: 34 U/L (ref 10–35)
Albumin: 4.2 g/dL (ref 3.6–5.1)
Alkaline Phosphatase: 62 U/L (ref 40–115)
BUN: 11 mg/dL (ref 7–25)
CO2: 19 mmol/L — ABNORMAL LOW (ref 20–32)
Calcium: 9.5 mg/dL (ref 8.6–10.3)
Chloride: 105 mmol/L (ref 98–110)
Creat: 0.87 mg/dL (ref 0.70–1.33)
GFR, Est African American: >89 mL/min (ref >=60)
GFR, Est Non African American: >89 mL/min (ref >=60)
Glucose, Bld: 116 mg/dL — ABNORMAL HIGH (ref 65–99)
Potassium: 4.4 mmol/L (ref 3.5–5.3)
Sodium: 139 mmol/L (ref 135–146)
Total Bilirubin: 0.8 mg/dL (ref 0.2–1.2)
Total Protein: 6.6 g/dL (ref 6.1–8.1)

## 2017-05-29 LAB — ACUTE HEP PANEL AND HEP B SURFACE AB
HCV Ab: NONREACTIVE
Hep A IgM: NONREACTIVE
Hep B C IgM: 0.12
Hep B S Ab: REACTIVE — AB
Hepatitis B Surface Ag: NONREACTIVE

## 2017-05-29 LAB — HEMOGLOBIN A1C
Hgb A1c MFr Bld: 5.7 % — ABNORMAL HIGH (ref <5.7)
Mean Plasma Glucose: 117 mg/dL

## 2017-05-29 LAB — HEPATITIS PANEL, ACUTE
HCV Ab: NONREACTIVE
Hep A IgM: NONREACTIVE
Hep B C IgM: 0.12
Hepatitis B Surface Ag: NONREACTIVE

## 2017-05-29 LAB — LIPID PANEL
Cholesterol: 227 mg/dL — ABNORMAL HIGH (ref <200)
HDL: 45 mg/dL (ref >40)
LDL Cholesterol: 148 mg/dL — ABNORMAL HIGH (ref <100)
Total CHOL/HDL Ratio: 5 Ratio — ABNORMAL HIGH (ref <5.0)
Triglycerides: 170 mg/dL — ABNORMAL HIGH (ref <150)
VLDL: 34 mg/dL — ABNORMAL HIGH (ref <30)

## 2017-05-29 LAB — RPR

## 2017-05-29 LAB — PSA, TOTAL WITH REFLEX TO PSA, FREE: PSA, Total: 0.6 ng/mL (ref <=4.0)

## 2017-05-29 LAB — HIV ANTIBODY (ROUTINE TESTING W REFLEX): HIV 1&2 Ab, 4th Generation: NONREACTIVE

## 2017-05-29 NOTE — Assessment & Plan Note (Addendum)
Significant trigger for his general anxiety, see A&P Controlled on Propanolol

## 2017-05-29 NOTE — Assessment & Plan Note (Signed)
Uncontrolled cholesterol on lifestyle, some weight gain back Last lipid panel 04/2016 - elevated LDL  Plan: 1. Discussion on ASCVD risk reduction / control LDL - not interested in statin or ASA at this time 2. Check fasting lipid panel - calculated ASCVD risk follow-up 3. Encourage improved lifestyle - low carb/cholesterol, reduce portion size, continue improving regular exercise 4. Follow-up 1 yr

## 2017-05-29 NOTE — Assessment & Plan Note (Signed)
Subacute on chronic L LBP with associated L sciatica. Suspect likely due to muscle spasm/strain, without known injury or trauma. In setting of known chronic LBP with DJD,  - No red flag symptoms. Negative SLR for radiculopathy - Inadequate conservative therapy   Plan: 1. Start regular Tylenol dosing 2. Refilled Baclofen PRN 3. Follow-up with Chiropractor, x-ray images 4. May consider return to Ortho in [place] if interested

## 2017-05-29 NOTE — Assessment & Plan Note (Signed)
Check PSA today Patient has Urologist in [place], has had normal PSA in past, but requests re-check today

## 2017-05-29 NOTE — Assessment & Plan Note (Signed)
Well-controlled Pre-DM with A1c 5.8 in past Concern with HLD  Plan:  1. Not on any therapy currently 2. Encourage improved lifestyle - low carb, low sugar diet, reduce portion size, continue improving regular exercise 3. Follow-up q 6-12 months A1c

## 2017-07-01 DIAGNOSIS — D481 Neoplasm of uncertain behavior of connective and other soft tissue: Secondary | ICD-10-CM | POA: Diagnosis not present

## 2017-09-08 DIAGNOSIS — D23121 Other benign neoplasm of skin of left upper eyelid, including canthus: Secondary | ICD-10-CM | POA: Diagnosis not present

## 2017-09-08 DIAGNOSIS — D481 Neoplasm of uncertain behavior of connective and other soft tissue: Secondary | ICD-10-CM | POA: Diagnosis not present

## 2017-09-08 DIAGNOSIS — D23111 Other benign neoplasm of skin of right upper eyelid, including canthus: Secondary | ICD-10-CM | POA: Diagnosis not present

## 2018-01-26 DIAGNOSIS — Z125 Encounter for screening for malignant neoplasm of prostate: Secondary | ICD-10-CM | POA: Diagnosis not present

## 2018-01-26 DIAGNOSIS — N5203 Combined arterial insufficiency and corporo-venous occlusive erectile dysfunction: Secondary | ICD-10-CM | POA: Diagnosis not present

## 2018-01-26 DIAGNOSIS — N200 Calculus of kidney: Secondary | ICD-10-CM | POA: Diagnosis not present

## 2018-03-31 ENCOUNTER — Telehealth: Payer: Self-pay | Admitting: Nurse Practitioner

## 2018-03-31 DIAGNOSIS — Z7252 High risk homosexual behavior: Secondary | ICD-10-CM

## 2018-03-31 NOTE — Telephone Encounter (Signed)
Incoming call

## 2018-03-31 NOTE — Telephone Encounter (Signed)
Ordered routine HIV screen test as requested, this is future order, he may schedule Lab Only apt to have blood drawn for this test only - if he needs other tests needs to notify office, otherwise he should follow-up  Nobie Putnam, [facility] 03/31/2018, 5:17 PM

## 2018-03-31 NOTE — Telephone Encounter (Signed)
Pt would like to do labs to check for HIV, Thurdsay if possible.  Please call [phone]

## 2018-04-02 ENCOUNTER — Other Ambulatory Visit: Payer: BLUE CROSS/BLUE SHIELD

## 2018-04-02 DIAGNOSIS — Z7252 High risk homosexual behavior: Secondary | ICD-10-CM

## 2018-04-03 LAB — HIV ANTIBODY (ROUTINE TESTING W REFLEX): HIV 1&2 Ab, 4th Generation: NONREACTIVE

## 2018-05-04 ENCOUNTER — Telehealth: Payer: Self-pay | Admitting: Family Medicine

## 2018-05-04 DIAGNOSIS — M1000 Idiopathic gout, unspecified site: Secondary | ICD-10-CM

## 2018-05-04 MED ORDER — COLCHICINE 0.6 MG PO TABS: ORAL_TABLET | ORAL | 3 refills | Status: DC

## 2018-05-04 NOTE — Telephone Encounter (Signed)
Refilled Colchicine for PRN gout flare only. Lowered # of pills and refills to PRN use only  Sent to [place] 05/04/2018, 1:35 PM

## 2018-05-04 NOTE — Telephone Encounter (Signed)
Pt called requesting  Refill on colchicine  0.6 called into  [facility]  # [phone]....... Pt  call back # is [phone]

## 2018-05-25 ENCOUNTER — Encounter: Payer: BLUE CROSS/BLUE SHIELD | Admitting: Family Medicine

## 2018-06-01 ENCOUNTER — Encounter: Payer: BLUE CROSS/BLUE SHIELD | Admitting: Family Medicine

## 2018-06-03 ENCOUNTER — Encounter: Payer: BLUE CROSS/BLUE SHIELD | Admitting: Family Medicine

## 2018-06-25 ENCOUNTER — Encounter: Payer: BLUE CROSS/BLUE SHIELD | Admitting: Family Medicine

## 2018-07-01 ENCOUNTER — Other Ambulatory Visit: Payer: Self-pay | Admitting: Family Medicine

## 2018-07-01 ENCOUNTER — Ambulatory Visit
Admission: RE | Admit: 2018-07-01 | Discharge: 2018-07-01 | Disposition: A | Discharge status: Home / Self Care | Payer: BLUE CROSS/BLUE SHIELD | Source: Ambulatory Visit | Attending: Family Medicine | Admitting: Family Medicine

## 2018-07-01 ENCOUNTER — Ambulatory Visit (INDEPENDENT_AMBULATORY_CARE_PROVIDER_SITE_OTHER): Payer: BLUE CROSS/BLUE SHIELD | Admitting: Family Medicine

## 2018-07-01 ENCOUNTER — Encounter: Payer: Self-pay | Admitting: Family Medicine

## 2018-07-01 VITALS — BP 136/91 | HR 57 | Temp 98.1°F | Resp 16 | Ht 71.0 in | Wt 208.6 lb

## 2018-07-01 DIAGNOSIS — F411 Generalized anxiety disorder: Secondary | ICD-10-CM

## 2018-07-01 DIAGNOSIS — Z7252 High risk homosexual behavior: Secondary | ICD-10-CM

## 2018-07-01 DIAGNOSIS — B360 Pityriasis versicolor: Secondary | ICD-10-CM

## 2018-07-01 DIAGNOSIS — Z0000 Encounter for general adult medical examination without abnormal findings: Secondary | ICD-10-CM | POA: Diagnosis not present

## 2018-07-01 DIAGNOSIS — K219 Gastro-esophageal reflux disease without esophagitis: Secondary | ICD-10-CM | POA: Diagnosis not present

## 2018-07-01 DIAGNOSIS — F418 Other specified anxiety disorders: Secondary | ICD-10-CM

## 2018-07-01 DIAGNOSIS — Z125 Encounter for screening for malignant neoplasm of prostate: Secondary | ICD-10-CM

## 2018-07-01 DIAGNOSIS — M25521 Pain in right elbow: Secondary | ICD-10-CM

## 2018-07-01 DIAGNOSIS — E782 Mixed hyperlipidemia: Secondary | ICD-10-CM

## 2018-07-01 DIAGNOSIS — F410 Panic disorder [episodic paroxysmal anxiety] without agoraphobia: Secondary | ICD-10-CM

## 2018-07-01 DIAGNOSIS — M1000 Idiopathic gout, unspecified site: Secondary | ICD-10-CM

## 2018-07-01 DIAGNOSIS — R7303 Prediabetes: Secondary | ICD-10-CM

## 2018-07-01 DIAGNOSIS — M722 Plantar fascial fibromatosis: Secondary | ICD-10-CM

## 2018-07-01 MED ORDER — NAPROXEN 500 MG PO TABS: 500.0000 mg | ORAL_TABLET | Freq: Two times a day (BID) | ORAL | 2 refills | Status: DC

## 2018-07-01 MED ORDER — OMEPRAZOLE 20 MG PO CPDR: 20.0000 mg | DELAYED_RELEASE_CAPSULE | Freq: Every day | ORAL | 11 refills | Status: DC

## 2018-07-01 MED ORDER — KETOCONAZOLE 2 % EX SHAM
1.0000 | MEDICATED_SHAMPOO | Freq: Every day | CUTANEOUS | 1 refills | Status: DC | PRN
Start: 2018-07-01 — End: 2024-07-05

## 2018-07-01 MED ORDER — SUCRALFATE 1 G PO TABS: 1.0000 g | ORAL_TABLET | Freq: Three times a day (TID) | ORAL | 0 refills | Status: DC

## 2018-07-01 NOTE — Assessment & Plan Note (Signed)
Homosexual male MSM, unprotected anal intercourse Check routine HIV screen with labs Routine precautions given for safe sex

## 2018-07-01 NOTE — Assessment & Plan Note (Signed)
Suspected acute flare on chronic GERD with recent worsening due to trigger foods and intermittent med non adherence - No GI red flag symptoms. Exam is unremarkable with benign abdomen with only mild epigastric discomfort deeper palpation - Chronic history of GERD, only intermittent inadequate treatments PRN PPI - History not suggestive of PUD today but cannot rule out  Plan: 1. Refilled rx Omeprazole 20mg  daily 30 min prior to 1st meal regular dosing now, not PRN - may need to adjust in future to 40 vs 20 BID 2. Diet modifications reduce GERD 3. Also given rx Carafate PRN 4. Follow-up as needed return criteria

## 2018-07-01 NOTE — Assessment & Plan Note (Signed)
Currently stable Significant trigger for his general anxiety, see A&P Controlled on Propanolol 10mg  daily PRN - not using more frequently

## 2018-07-01 NOTE — Progress Notes (Signed)
Subjective:    Patient ID: Alec Hurst, male    DOB: Jul 23, 1961, 57 y.o.   MRN: [id number]  Alec Hurst is a 57 y.o. male presenting on 07/01/2018 for Annual Exam; Elbow Pain; Foot Pain; and Gastroesophageal Reflux   HPI   Here for Annual Physical and due for fasting labs today. Also he has several other medical complaints listed below  Pre-Diabetes Prior readings 5.7 to 5.8 CBGs: Not checking CBG Meds: Never on meds Currently not on ACEi/ARB Lifestyle: - Diet (balanced diet, no particular diet plan)  - Exercise (working on improving regular exercise. Limited by elbow and foot and back)  HYPERLIPIDEMIA: - Reports no concerns. Last lipid panel 2018, elevated readings. Due for lipids now, fasting today. - Currently taking Atorvastatin 20mg , tolerating well without side effects or myalgias  High Risk Sexual Behavior / MSM Patient is requesting updated routine HIV screen today, last negative 03/2018. He is homosexual and unprotected anal intercourse without condoms with male partner. 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  - He takes Propranolol 10mg  daily PRN for performance anxiety, otherwise not taking regularly or other meds - Currently doing well   Additional complaints today:  History of Gout Flare / Left Foot pain plantar fasciitis Last gout lvl Uric acid >8 back in 2017. He has colchicine PRN. Recently had gout flare in June 2019, he took colchicine as prescribed for up to 1-2 months with initial some improvement then less results. - he is concerned now with persistent L foot pain. More forefoot, worse in morning, feels sharp stabbing pain in Left foot forefoot and has bunion, worse with ambulating and putting direct  pressure on it, now affecting him more often, not just morning. Gradual worsening problem. Some symptoms on R side as well. 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 No prior known dx of arthritis, he is concerned about this. No prior injury or trauma. He has history of back pain and sciatica and OA/DJD of other joints - No longer taking meloxicam for his back.  GERD Reports he is taking Omeprazole 20mg  PRN mostly in evening only if certain diet high acidic foods. Recently had severe episode of heartburn, persisted for several hours, chest and upper abdominal pain, did not have any other associated symptoms, it subsided. - Today need refill Omeparzole Denies dark stool or blood in stool, nausea vomiting, chest pain, dyspnea, nausea vomiting   Dermatology [facility]) - Prior dx tinea versicolor and some seborrheic dermatitis, requested refill on Ketoconazole shampoo 2%, it was helpful before. Needs refill today.   Health Maintenance:  Due for Flu Shot, declines today despite counseling on benefits  History of prior colon polyps up to 12, precancerous followed by [facility] GI > but lives in [place] and plans to return there for GI, last colonoscopy 2017, q 2-3 years, last with 2 polyps, benign  Depression screen [facility] 2/9 07/01/2018 05/28/2017 05/20/2016  Decreased Interest 0 3 0  Down, Depressed, Hopeless 0 - 0  PHQ - 2 Score 0 3 0  Altered sleeping 0 3 -  Tired, decreased energy 0 3 -  Change in appetite 0 2 -  Feeling bad or failure about yourself  0 2 -  Trouble concentrating 0 0 -  Moving slowly or fidgety/restless 0 0 -  Suicidal thoughts 0 0 -  PHQ-9 Score 0 13 -  Difficult doing work/chores Not difficult at all Somewhat difficult -    Past Medical History:  Diagnosis Date  . Calculus of kidney     . Delayed ejaculation   . ED (erectile dysfunction)   . Hx of colonic polyps   . Impotence, organic   . Malaise and fatigue   . Panic disorder with agoraphobia   . Sciatica    Past Surgical History:  Procedure Laterality Date  . LITHOTRIPSY     Social History   Socioeconomic History  . Marital status: Soil scientist    Spouse name: Not on file  . Number of children: Not on file  . Years of education: Not on file  . Highest education level: Not on file  Occupational History  . Occupation: Musician Copywriter, advertising, Photographer)  Social Needs  . Financial resource strain: Not on file  . Food insecurity:    Worry: Not on file    Inability: Not on file  . Transportation needs:    Medical: Not on file    Non-medical: Not on file  Tobacco Use  . Smoking status: Never Smoker  . Smokeless tobacco: Never Used  Substance and Sexual Activity  . Alcohol use: Yes    Alcohol/week: 0.0 standard drinks    Comment: occasional  . Drug use: No  . Sexual activity: Not on file  Lifestyle  . Physical activity:    Days per week: Not on file    Minutes per session: Not on file  . Stress: Not on file  Relationships  . Social connections:    Talks on phone: Not on file    Gets together: Not on file    Attends religious service: Not on file    Active member of club or organization: Not on file    Attends meetings of clubs or organizations: Not on file    Relationship status: Not on file  . Intimate partner violence:    Fear of current or ex partner: Not on file    Emotionally abused: Not on file    Physically abused: Not on file    Forced sexual activity: Not on file  Other Topics Concern  . Not on file  Social History Narrative  . Not on file   Family History  Problem Relation Age of Onset  . Colon cancer Maternal Aunt   . Other Mother        rx med addiction before passing   Current Outpatient Medications on File Prior to Visit  Medication Sig  . atorvastatin (LIPITOR) 20  MG tablet atorvastatin 20 mg tablet  take 1 tablet by mouth every evening  . colchicine 0.6 MG tablet Take only if gout flare - start with 2 tablets then may take 1 tab 2 hours later, then 1 twice daily until flare resolves  . Melatonin 5 MG TABS Take 5 mg by mouth at bedtime as needed.  . sildenafil (REVATIO) 20 MG tablet Take 20 mg by mouth 3 (three) times daily.  . propranolol (INDERAL) 10 MG tablet Take 1 tablet (10 mg total) by mouth daily as needed (performance anxiety, panic).   No current facility-administered medications on file prior to visit.  Review of Systems  Constitutional: Negative for activity change, appetite change, chills, diaphoresis, fatigue and fever.  HENT: Negative for congestion and hearing loss.   Eyes: Negative for visual disturbance.  Respiratory: Negative for apnea, cough, chest tightness, shortness of breath and wheezing.   Cardiovascular: Negative for chest pain, palpitations and leg swelling.  Gastrointestinal: Negative for abdominal pain, anal bleeding, blood in stool, constipation, diarrhea, nausea and vomiting.  Endocrine: Negative for cold intolerance.  Genitourinary: Negative for decreased urine volume, difficulty urinating, dysuria, frequency and hematuria.  Musculoskeletal: Positive for arthralgias (R elbow, L foot). Negative for back pain and neck pain.  Skin: Negative for rash.  Allergic/Immunologic: Negative for environmental allergies.  Neurological: Negative for dizziness, weakness, light-headedness, numbness and headaches.  Hematological: Negative for adenopathy.  Psychiatric/Behavioral: Negative for behavioral problems, dysphoric mood and sleep disturbance. The patient is not hyperactive.    Per HPI unless specifically indicated above     Objective:    BP (!) 136/91   Pulse (!) 57   Temp 98.1 F (36.7 C) (Oral)   Resp 16   Ht 5\' 11"  (1.803 m)   Wt 208 lb 9.6 oz (94.6 kg)   BMI 29.09 kg/m   Wt Readings from Last 3 Encounters:    07/01/18 208 lb 9.6 oz (94.6 kg)  05/28/17 201 lb 3.2 oz (91.3 kg)  05/20/16 200 lb 8 oz (90.9 kg)    Physical Exam  Constitutional: He is oriented to person, place, and time. He appears well-developed and well-nourished. No distress.  Well-appearing, comfortable, cooperative  HENT:  Head: Normocephalic and atraumatic.  Mouth/Throat: Oropharynx is clear and moist.  Eyes: Pupils are equal, round, and reactive to light. Conjunctivae and EOM are normal. Right eye exhibits no discharge. Left eye exhibits no discharge.  Neck: Normal range of motion. Neck supple. No thyromegaly present.  Cardiovascular: Normal rate, regular rhythm, normal heart sounds and intact distal pulses.  No murmur heard. Pulmonary/Chest: Effort normal and breath sounds normal. No respiratory distress. He has no wheezes. He has no rales.  Abdominal: Soft. Bowel sounds are normal. He exhibits no distension and no mass. There is tenderness (epigastric discomfort).  Ventral wall some diastasis without herniation, prior umbilical hernia  Musculoskeletal: He exhibits no edema.  Upper / Lower Extremities: - Normal muscle tone, strength bilateral upper extremities 5/5, lower extremities 5/5  Right Elbow Inspection: normal appearance without edema or erythema Palpation: localized bony tenderness lateral to olecranon, not triggered over muscles forearm ROM: full active flex/exend, supination and pronation Strength: intact 5/5 Neurovascular: intact distal  Left Foot Inspection: mildly high arch pes cavus, bunion formation with reduced swelling and erythema now. Palpation: local tender forefoot 2nd/3rd toes metatarsal region ROM: full active Strength: distal intact Neurovascular: distal intact   Lymphadenopathy:    He has no cervical adenopathy.  Neurological: He is alert and oriented to person, place, and time.  Distal sensation intact to light touch all extremities  Skin: Skin is warm and dry. No rash (resolved now)  noted. He is not diaphoretic. No erythema.  Psychiatric: He has a normal mood and affect. His behavior is normal.  Well groomed, good eye contact, normal speech and thoughts  Nursing note and vitals reviewed.  I have personally reviewed the radiology report from R Elbow X-ray on 07/01/18.  CLINICAL DATA:  Three-month history of RIGHT elbow pain. No known injuries.  EXAM: RIGHT ELBOW - COMPLETE 3+ VIEW  COMPARISON:  None.  FINDINGS: No evidence of acute, subacute or healed  fractures. Incompletely united accessory ossicle for the MEDIAL epicondyle. Well-preserved joint spaces. Well-preserved bone mineral density. No POSTERIOR fat pad to indicate a joint effusion.  IMPRESSION: No significant osseous abnormality.   Electronically Signed   By: Evangeline Dakin M.D.   On: 07/01/2018 17:06  Results for orders placed or performed in visit on 04/02/18  HIV antibody  Result Value Ref Range   HIV 1&2 Ab, 4th Generation NON-REACTIVE NON-REACTI      Assessment & Plan:   Problem List Items Addressed This Visit    Generalized anxiety disorder with panic attacks    Consistent with chronic GAD with panic Seems more performance related Not endorsing depression has resolved Some history of insomnia - Failed SSRI due to sexual dysfunction - No prior Psych / counseling  Plan: 1. Continue propranolol daily PRN only  Follow-up as needed if not improved anxiety, med adjust, GAD7/PHQ9      Relevant Medications   propranolol (INDERAL) 10 MG tablet   Other Relevant Orders   COMPLETE METABOLIC PANEL WITH GFR   GERD without esophagitis    Suspected acute flare on chronic GERD with recent worsening due to trigger foods and intermittent med non adherence - No GI red flag symptoms. Exam is unremarkable with benign abdomen with only mild epigastric discomfort deeper palpation - Chronic history of GERD, only intermittent inadequate treatments PRN PPI - History not suggestive of PUD  today but cannot rule out  Plan: 1. Refilled rx Omeprazole 20mg  daily 30 min prior to 1st meal regular dosing now, not PRN - may need to adjust in future to 40 vs 20 BID 2. Diet modifications reduce GERD 3. Also given rx Carafate PRN 4. Follow-up as needed return criteria      Relevant Medications   omeprazole (PRILOSEC) 20 MG capsule   sucralfate (CARAFATE) 1 g tablet   Other Relevant Orders   CBC with Differential/Platelet   COMPLETE METABOLIC PANEL WITH GFR   Gout Prior gout flare, in foot, responded to colchicine. Now has lingering symptoms seems less likely gout. Not on prophylaxis Last Uric acid >8, 2017  Plan Check Uric acid level Stop Colchicine Start NSAID Naproxen therapeutic dose for now for other problems, and in future if needed will consider start low dose Allopurinol 100mg  daily may need prophylaxis with low dose NSAID naproxen 250 BID for period of time.    Relevant Orders   Uric acid   High risk sexual behavior    Homosexual male MSM, unprotected anal intercourse Check routine HIV screen with labs Routine precautions given for safe sex      Relevant Orders   HIV antibody   Hyperlipidemia    Prior uncontrolled lipids, now on statin due for re-check Last lipid panel 05/2017  Plan: 1. Continue Atorvastatin 20mg  daily 2. Check fasting lipid panel - calculated ASCVD risk follow-up 3. Encourage improved lifestyle - low carb/cholesterol, reduce portion size, continue improving regular exercise      Relevant Medications   propranolol (INDERAL) 10 MG tablet   Other Relevant Orders   Lipid panel   Performance anxiety    Currently stable Significant trigger for his general anxiety, see A&P Controlled on Propanolol 10mg  daily PRN - not using more frequently      Pre-diabetes    Well-controlled Pre-DM with A1c 5.7 to 5.8 in past Concern with HLD  Check A1c today with labs  Plan:  1. Not on any therapy currently 2. Encourage improved lifestyle - low  carb, low sugar  diet, reduce portion size, continue improving regular exercise 3. Follow-up q 6-12 months A1c      Relevant Orders   Hemoglobin A1c   Screening for prostate cancer   Relevant Orders   PSA    Other Visit Diagnoses    Annual physical exam    -  Primary  Updated Health Maintenance information Reviewed recent lab results with patient Encouraged improvement to lifestyle with diet and exercise    Relevant Orders   Hemoglobin A1c   CBC with Differential/Platelet   COMPLETE METABOLIC PANEL WITH GFR   Lipid panel   Tinea versicolor     Currently without flare, prior dx by Dermatology Refill Ketoconazole PRN    Relevant Medications   ketoconazole (NIZORAL) 2 % shampoo   Plantar fasciitis of left foot     Clinically consistent with subacute vs chronic L plantar fasciitis based on history however possibly exam suggestive of neuroma or metatarsalgia of other etiology - No known injury. Unlikely fracture based on history, no bony tenderness. - Limited conservative therapy  Plan: 1. Reviewed diagnosis and management 2. Start rx NSAID trial - Naproxen 500mg  BID wc x 2-4 weeks then PRN 3. May take Tylenol PRN breakthrough 4. May use topical muscle rub, ice 5. Emphasized importance of relative rest, ice, and avoid prolonged standing and overuse 6. Handout given and explained appropriate stretching and home exercises important to do first thing in morning and later 7. 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 My recommendations were likely to be tendonitis possibly lateral epicondylitis,  continue current management, NSAID, relative rest, compression RICE therapy, can use forearm strap.   Relevant Medications   naproxen (NAPROSYN) 500 MG tablet   Other Relevant Orders   DG Elbow Complete Right      Meds ordered this encounter  Medications  . ketoconazole (NIZORAL) 2 % shampoo    Sig: Apply 1 application topically daily as needed for irritation. Up to 1 week as needed for flare    Dispense:  120 mL    Refill:  1  . omeprazole (PRILOSEC) 20 MG capsule    Sig: Take 1 capsule (20 mg total) by mouth daily before breakfast.    Dispense:  30 capsule    Refill:  11  . naproxen (NAPROSYN) 500 MG tablet    Sig: Take 1 tablet (500 mg total) by mouth 2 (two) times daily with a meal. For 2-4 weeks then as needed    Dispense:  60 tablet    Refill:  2  . sucralfate (CARAFATE) 1 g tablet    Sig: Take 1 tablet (1 g total) by mouth 4 (four) times daily -  with meals and at bedtime. Only as needed for acid reflux    Dispense:  40 tablet    Refill:  0     Follow up plan: Return in about 3 months (around 09/30/2018) for plantar fascia / gout / GERD.  Nobie Putnam, [facility] 07/01/2018, 1:29 PM

## 2018-07-01 NOTE — Assessment & Plan Note (Signed)
Consistent with chronic GAD with panic Seems more performance related Not endorsing depression has resolved Some history of insomnia - Failed SSRI due to sexual dysfunction - No prior Psych / counseling  Plan: 1. Continue propranolol daily PRN only  Follow-up as needed if not improved anxiety, med adjust, GAD7/PHQ9

## 2018-07-01 NOTE — Assessment & Plan Note (Signed)
Well-controlled Pre-DM with A1c 5.7 to 5.8 in past Concern with HLD  Check A1c today with labs  Plan:  1. Not on any therapy currently 2. Encourage improved lifestyle - low carb, low sugar diet, reduce portion size, continue improving regular exercise 3. Follow-up q 6-12 months A1c

## 2018-07-01 NOTE — Patient Instructions (Addendum)
Thank you for coming to the office today.  X-ray R elbow, likely some wear and tear arthritis  Recommend trial of Anti-inflammatory with Naproxen (Naprosyn) 500mg  tabs - take one with food and plenty of water TWICE daily every day (breakfast and dinner), for next 2 to 4 weeks, then you may take only as needed - DO NOT TAKE any ibuprofen, aleve, motrin while you are taking this medicine - It is safe to take Tylenol Ext Str 500mg  tabs - take 1 to 2 (max dose 1000mg ) every 6 hours as needed for breakthrough pain, max 24 hour daily dose is 6 to 8 tablets or 4000mg   For both foot and elbow.  May try Glucosamine and Chondroitin  Labs ordered.  Last colonoscopy done by Dr Gaylyn Cheers 07/2015 - so next due in approx 2021 (total of 5 years)  May need Podiatry if foot not improving. Try exercises and medicine as advised.  Start taking Omeprazole every day before breakfast Take Sucralfate as needed with meals for acid  Please schedule a Follow-up Appointment to: Return in about 3 months (around 09/30/2018) for plantar fascia / gout / GERD.  If you have any other questions or concerns, please feel free to call the office or send a message through [place]. You may also schedule an earlier appointment if necessary.  Additionally, you may be receiving a survey about your experience at our office within a few days to 1 week by e-mail or mail. We value your feedback.  Nobie Putnam, DO [facility], [facility]              Plantar Fascia Stretches / Exercises  See other page with pictures of each exercise.  Start with 1 or 2 of these exercises that you are most comfortable with. Do not do any exercises that cause you significant worsening pain. Some of these may cause some "stretching soreness" but it should go away after you stop the exercise, and get better over time. Gradually increase up to 3-4 exercises as tolerated.  You may begin exercising the muscles of  your foot right away by gently stretching them as follows:  Stretching: Towel stretch: Sit on a hard surface with your injured leg stretched out in front of you. Loop a towel around the ball of your foot and pull the towel toward your body keeping your knee straight. Hold this position for 15 to 30 seconds then relax. Repeat 3 times. When the towel stretch becomes to easy, you may begin doing the standing calf stretch.  Standing calf stretch: Facing a wall, put your hands against the wall at about eye level. Keep the injured leg back, the uninjured leg forward, and the heel of your injured leg on the floor. Turn your injured foot slightly inward (as if you were pigeon-toed) as you slowly lean into the wall until you feel a stretch in the back of your calf. Hold for 15 to 30 seconds. Repeat 3 times. Do this exercise several times each day. When you can stand comfortably on your injured foot, you can begin stretching the bottom of your foot using the plantar fascia stretch.  Plantar fascia stretch: Stand with the ball of your injured foot on a stair. Reach for the bottom step with your heel until you feel a stretch in the arch of your foot. Hold this position for 15 to 30 seconds and then relax. Repeat 3 times. After you have stretched the bottom muscles of your foot, you can begin  strengthening the top muscles of your foot.  Frozen can roll: Roll your bare injured foot back and forth from your heel to your mid-arch over a frozen juice can. Repeat for 3 to 5 minutes. This exercise is particularly helpful if done first thing in the morning. Towel pickup: With your heel on the ground, pick up a towel with your toes. Release. Repeat 10 to 20 times. When this gets easy, add more resistance by placing a book or small weight on the towel. Static and dynamic balance exercises Place a chair next to your non-injured leg and stand upright. (This will provide you with balance if needed.) Stand on your injured  foot. Try to raise the arch of your foot while keeping your toes on the floor. Try to maintain this position and balance on your injured side for 30 seconds. This exercise can be made more difficult by doing it on a piece of foam or a pillow, or with your eyes closed. Stand in the same position as above. Keep your foot in this position and reach forward in front of you with your injured side's hand, allowing your knee to bend. Repeat this 10 times while maintaining the arch height. This exercise can be made more difficult by reaching farther in front of you. Do 2 sets. Stand in the same position as above. While maintaining your arch height, reach the injured side's hand across your body toward the chair. The farther you reach, the more challenging the exercise. Do 2 sets of 10.  Next, you can begin strengthening the muscles of your foot and lower leg by using elastic tubing.  Strengthening: Resisted dorsiflexion: Sit with your injured leg out straight and your foot facing a doorway. Tie a loop in one end of the tubing. Put your foot through the loop so that the tubing goes around the arch of your foot. Tie a knot in the other end of the tubing and shut the knot in the door. Move backward until there is tension in the tubing. Keeping your knee straight, pull your foot toward your body, stretching the tubing. Slowly return to the starting position. Do 3 sets of 10. Resisted plantar flexion: Sit with your leg outstretched and loop the middle section of the tubing around the ball of your foot. Hold the ends of the tubing in both hands. Gently press the ball of your foot down and point your toes, stretching the tubing. Return to the starting position. Do 3 sets of 10. Resisted inversion: Sit with your legs out straight and cross your uninjured leg over your injured ankle. Wrap the tubing around the ball of your injured foot and then loop it around your uninjured foot so that the tubing is anchored there at one  end. Hold the other end of the tubing in your hand. Turn your injured foot inward and upward. This will stretch the tubing. Return to the starting position. Do 3 sets of 10. Resisted eversion: Sit with both legs stretched out in front of you, with your feet about a shoulder's width apart. Tie a loop in one end of the tubing. Put your injured foot through the loop so that the tubing goes around the arch of that foot and wraps around the outside of the uninjured foot. Hold onto the other end of the tubing with your hand to provide tension. Turn your injured foot up and out. Make sure you keep your uninjured foot still so that it will allow the tubing to stretch  as you move your injured foot. Return to the starting position. Do 3 sets of 10.

## 2018-07-01 NOTE — Assessment & Plan Note (Signed)
Prior uncontrolled lipids, now on statin due for re-check Last lipid panel 05/2017  Plan: 1. Continue Atorvastatin 20mg  daily 2. Check fasting lipid panel - calculated ASCVD risk follow-up 3. Encourage improved lifestyle - low carb/cholesterol, reduce portion size, continue improving regular exercise

## 2018-07-02 ENCOUNTER — Other Ambulatory Visit: Payer: Self-pay | Admitting: Family Medicine

## 2018-07-02 DIAGNOSIS — R7989 Other specified abnormal findings of blood chemistry: Secondary | ICD-10-CM

## 2018-07-02 DIAGNOSIS — M1000 Idiopathic gout, unspecified site: Secondary | ICD-10-CM

## 2018-07-02 DIAGNOSIS — R945 Abnormal results of liver function studies: Secondary | ICD-10-CM

## 2018-07-02 DIAGNOSIS — R7303 Prediabetes: Secondary | ICD-10-CM

## 2018-07-02 DIAGNOSIS — E782 Mixed hyperlipidemia: Secondary | ICD-10-CM

## 2018-07-02 LAB — COMPLETE METABOLIC PANEL WITH GFR
AG Ratio: 2 (calc) (ref 1.0–2.5)
ALT: 117 U/L — ABNORMAL HIGH (ref 9–46)
AST: 74 U/L — ABNORMAL HIGH (ref 10–35)
Albumin: 4.5 g/dL (ref 3.6–5.1)
Alkaline phosphatase (APISO): 65 U/L (ref 40–115)
BUN: 14 mg/dL (ref 7–25)
CO2: 28 mmol/L (ref 20–32)
Calcium: 9.7 mg/dL (ref 8.6–10.3)
Chloride: 104 mmol/L (ref 98–110)
Creat: 0.96 mg/dL (ref 0.70–1.33)
GFR, Est African American: 102 mL/min/{1.73_m2} (ref >=60)
GFR, Est Non African American: 88 mL/min/{1.73_m2} (ref >=60)
Globulin: 2.3 g/dL (calc) (ref 1.9–3.7)
Glucose, Bld: 113 mg/dL — ABNORMAL HIGH (ref 65–99)
Potassium: 4.3 mmol/L (ref 3.5–5.3)
Sodium: 139 mmol/L (ref 135–146)
Total Bilirubin: 0.9 mg/dL (ref 0.2–1.2)
Total Protein: 6.8 g/dL (ref 6.1–8.1)

## 2018-07-02 LAB — LIPID PANEL
Cholesterol: 239 mg/dL — ABNORMAL HIGH (ref <200)
HDL: 44 mg/dL (ref >40)
LDL Cholesterol (Calc): 164 mg/dL (calc) — ABNORMAL HIGH
Non-HDL Cholesterol (Calc): 195 mg/dL (calc) — ABNORMAL HIGH (ref <130)
Total CHOL/HDL Ratio: 5.4 (calc) — ABNORMAL HIGH (ref <5.0)
Triglycerides: 162 mg/dL — ABNORMAL HIGH (ref <150)

## 2018-07-02 LAB — CBC WITH DIFFERENTIAL/PLATELET
Basophils Absolute: 48 cells/uL (ref 0–200)
Basophils Relative: 0.8 %
Eosinophils Absolute: 60 cells/uL (ref 15–500)
Eosinophils Relative: 1 %
HCT: 47.6 % (ref 38.5–50.0)
Hemoglobin: 17 g/dL (ref 13.2–17.1)
Lymphs Abs: 1788 cells/uL (ref 850–3900)
MCH: 33.2 pg — ABNORMAL HIGH (ref 27.0–33.0)
MCHC: 35.7 g/dL (ref 32.0–36.0)
MCV: 93 fL (ref 80.0–100.0)
MPV: 11.4 fL (ref 7.5–12.5)
Monocytes Relative: 10.3 %
Neutro Abs: 3486 cells/uL (ref 1500–7800)
Neutrophils Relative %: 58.1 %
Platelets: 273 10*3/uL (ref 140–400)
RBC: 5.12 10*6/uL (ref 4.20–5.80)
RDW: 12.4 % (ref 11.0–15.0)
Total Lymphocyte: 29.8 %
WBC mixed population: 618 cells/uL (ref 200–950)
WBC: 6 10*3/uL (ref 3.8–10.8)

## 2018-07-02 LAB — PSA: PSA: 0.4 ng/mL (ref <=4.0)

## 2018-07-02 LAB — URIC ACID: Uric Acid, Serum: 9.7 mg/dL — ABNORMAL HIGH (ref 4.0–8.0)

## 2018-07-02 LAB — HEMOGLOBIN A1C
Hgb A1c MFr Bld: 6.3 % of total Hgb — ABNORMAL HIGH (ref <5.7)
Mean Plasma Glucose: 134 (calc)
eAG (mmol/L): 7.4 (calc)

## 2018-07-02 LAB — HIV ANTIBODY (ROUTINE TESTING W REFLEX): HIV 1&2 Ab, 4th Generation: NONREACTIVE

## 2018-07-02 MED ORDER — METFORMIN HCL 500 MG PO TABS: 500.0000 mg | ORAL_TABLET | Freq: Two times a day (BID) | ORAL | 0 refills | Status: DC

## 2018-07-12 ENCOUNTER — Encounter: Payer: Self-pay | Admitting: Family Medicine

## 2018-09-28 ENCOUNTER — Encounter: Payer: Self-pay | Admitting: Family Medicine

## 2018-09-28 ENCOUNTER — Ambulatory Visit (INDEPENDENT_AMBULATORY_CARE_PROVIDER_SITE_OTHER): Payer: BLUE CROSS/BLUE SHIELD | Admitting: Family Medicine

## 2018-09-28 ENCOUNTER — Other Ambulatory Visit (HOSPITAL_COMMUNITY)
Admission: RE | Admit: 2018-09-28 | Discharge: 2018-09-28 | Disposition: A | Discharge status: Home / Self Care | Payer: BLUE CROSS/BLUE SHIELD | Source: Ambulatory Visit | Attending: Family Medicine | Admitting: Family Medicine

## 2018-09-28 VITALS — BP 149/83 | HR 61 | Temp 98.6°F | Resp 16 | Ht 71.0 in | Wt 206.0 lb

## 2018-09-28 DIAGNOSIS — B853 Phthiriasis: Secondary | ICD-10-CM

## 2018-09-28 DIAGNOSIS — Z7252 High risk homosexual behavior: Secondary | ICD-10-CM | POA: Diagnosis not present

## 2018-09-28 MED ORDER — PERMETHRIN 5 % EX CREA: TOPICAL_CREAM | CUTANEOUS | 0 refills | Status: DC

## 2018-09-28 NOTE — Patient Instructions (Addendum)
Thank you for coming to the office today.  STD Testing as discussed. Stay tuned for results  For Pubic Lice treatment - use higher dose Permethrin 5% cream whole body may repeat in 2 weeks if need - call if questions. Ask pharmacist for application advice  Please schedule a Follow-up Appointment to: Return in about 6 weeks (around 11/09/2018) for lab follow-up re-schedule from 09/2018 - to January.  If you have any other questions or concerns, please feel free to call the office or send a message through [place]. You may also schedule an earlier appointment if necessary.  Additionally, you may be receiving a survey about your experience at our office within a few days to 1 week by e-mail or mail. We value your feedback.  Nobie Putnam, DO [place]

## 2018-09-28 NOTE — Progress Notes (Signed)
Subjective:    Patient ID: Alec Hurst, male    DOB: 1961/06/30, 57 y.o.   MRN: [id number]  [name] is a 57 y.o. male presenting on 18/05/4165 for Head Lice (around gential area and wants STD works)  Patient presents for a same day appointment.  HPI   Prediculosis Pubis / High Risk Sexual Behavior Recent history of symptoms onset pubic hair with itching and noticed lice and some nits, he treated this with up to 3-4 rounds of OTC anti lice treatment topical permethrin 1% cream, locally and head to toe treatment. He was advised by pharmacist to see doctor for 5% topical. He has not had this problem before. He does admit that he had one sexual contact without intercourse, but had external contact, that partner did not report any lice or symptoms afterwards. He also describes some scenario with possible exposure in public place using bathroom facility - He now has been symptom free without itching or rash or any visual evidence of lice since the previous treatment, but again he is requesting to be treated with rx strength medicine to be sure - He also request STD testing with blood and urine for complete panel - Denies any fever chills, rash ulceration  Depression screen [facility] 2/9 09/28/2018 07/01/2018 05/28/2017  Decreased Interest 0 0 3  Down, Depressed, Hopeless 0 0 -  PHQ - 2 Score 0 0 3  Altered sleeping - 0 3  Tired, decreased energy - 0 3  Change in appetite - 0 2  Feeling bad or failure about yourself  - 0 2  Trouble concentrating - 0 0  Moving slowly or fidgety/restless - 0 0  Suicidal thoughts - 0 0  PHQ-9 Score - 0 13  Difficult doing work/chores - Not difficult at all Somewhat difficult    Social History   Tobacco Use  . Smoking status: Never Smoker  . Smokeless tobacco: Never Used  Substance Use Topics  . Alcohol use: Yes    Alcohol/week: 0.0 standard drinks    Comment: occasional  . Drug use: No    Review of Systems Per HPI unless specifically indicated  above     Objective:    BP (!) 149/83   Pulse 61   Temp 98.6 F (37 C) (Oral)   Resp 16   Ht 5\' 11"  (1.803 m)   Wt 206 lb (93.4 kg)   BMI 28.73 kg/m   Wt Readings from Last 3 Encounters:  09/28/18 206 lb (93.4 kg)  07/01/18 208 lb 9.6 oz (94.6 kg)  05/28/17 201 lb 3.2 oz (91.3 kg)    Physical Exam  Constitutional: He is oriented to person, place, and time. He appears well-developed and well-nourished. No distress.  Well-appearing, comfortable, cooperative  HENT:  Head: Normocephalic and atraumatic.  Eyes: Pupils are equal, round, and reactive to light. Conjunctivae and EOM are normal. Right eye exhibits no discharge. Left eye exhibits no discharge.  No sign of conjunctival injection or erythema, drainage.  No nits or lice seen on eye lashes  Cardiovascular: Normal rate.  Pulmonary/Chest: Effort normal.  Genitourinary:  Genitourinary Comments: External male genitalia and groin without any abnormality or lesion. No evidence of nits or lice seen.  Musculoskeletal: He exhibits no edema.  Neurological: He is alert and oriented to person, place, and time.  Skin: Skin is warm and dry. No rash noted. He is not diaphoretic. No erythema.  Psychiatric: He has a normal mood and affect. His behavior is normal.  Well  groomed, good eye contact, normal speech and thoughts  Nursing note and vitals reviewed.  Results for orders placed or performed in visit on 07/12/18  HM COLONOSCOPY  Result Value Ref Range   HM Colonoscopy See Report (in chart) See Report (in chart), Patient Reported      Assessment & Plan:   Problem List Items Addressed This Visit    High risk sexual behavior Asymptomatic, no known contact, has contact with other sexual partners but no intercourse by his report - Higher risk homosexual patient - Will check STD panel with lab draw HIV, RPR, and urine GC/Chlamydia    Relevant Orders   HIV Antibody (routine testing w rflx)   RPR   GC/Chlamydia probe amp ([facility])not at [facility]    Other Visit Diagnoses    Pediculosis pubis    -  Primary Clinically without evidence, may have already been eradicated with OTC permethrin 1% repeated treatment  Plan Repeat topical now rx strength Permethrin 5% cream Follow-up as needed    Relevant Medications   permethrin (ELIMITE) 5 % cream      Meds ordered this encounter  Medications  . permethrin (ELIMITE) 5 % cream    Sig: Apply cream whole body, from head to toe at bedtime; leave on for 8 to 12 hours (overnight), wash off next day. Repeat in 14 days if needed.    Dispense:  60 g    Refill:  0     Follow up plan: Return in about 6 weeks (around 11/09/2018) for lab follow-up re-schedule from 09/2018 - to January.  Future labs already in system for next office visit - re-scheduled to January 2020  Nobie Putnam, [facility] 09/28/2018, 12:21 PM

## 2018-09-29 LAB — GC/CHLAMYDIA PROBE AMP (~~LOC~~) NOT AT ARMC
Chlamydia: NEGATIVE
Neisseria Gonorrhea: NEGATIVE

## 2018-09-29 LAB — RPR: RPR Ser Ql: NONREACTIVE

## 2018-09-29 LAB — HIV ANTIBODY (ROUTINE TESTING W REFLEX): HIV 1&2 Ab, 4th Generation: NONREACTIVE

## 2018-09-30 ENCOUNTER — Other Ambulatory Visit: Payer: BLUE CROSS/BLUE SHIELD

## 2018-10-07 ENCOUNTER — Ambulatory Visit: Payer: BLUE CROSS/BLUE SHIELD | Admitting: Family Medicine

## 2018-11-20 ENCOUNTER — Telehealth: Payer: Self-pay | Admitting: Family Medicine

## 2018-11-20 NOTE — Telephone Encounter (Signed)
Patient advised.

## 2018-11-20 NOTE — Telephone Encounter (Signed)
Pt asked if he needed to fast for labs on Monday [phone]

## 2018-11-23 ENCOUNTER — Other Ambulatory Visit: Payer: BLUE CROSS/BLUE SHIELD

## 2018-11-23 DIAGNOSIS — M1000 Idiopathic gout, unspecified site: Secondary | ICD-10-CM

## 2018-11-23 DIAGNOSIS — E782 Mixed hyperlipidemia: Secondary | ICD-10-CM

## 2018-11-23 DIAGNOSIS — R945 Abnormal results of liver function studies: Secondary | ICD-10-CM

## 2018-11-23 DIAGNOSIS — R7303 Prediabetes: Secondary | ICD-10-CM

## 2018-11-23 DIAGNOSIS — R7989 Other specified abnormal findings of blood chemistry: Secondary | ICD-10-CM

## 2018-11-24 LAB — LIPID PANEL
Cholesterol: 224 mg/dL — ABNORMAL HIGH (ref <200)
HDL: 48 mg/dL (ref >=40)
LDL Cholesterol (Calc): 145 mg/dL (calc) — ABNORMAL HIGH
Non-HDL Cholesterol (Calc): 176 mg/dL (calc) — ABNORMAL HIGH (ref <130)
Total CHOL/HDL Ratio: 4.7 (calc) (ref <5.0)
Triglycerides: 171 mg/dL — ABNORMAL HIGH (ref <150)

## 2018-11-24 LAB — COMPLETE METABOLIC PANEL WITH GFR
AG Ratio: 1.8 (calc) (ref 1.0–2.5)
ALT: 42 U/L (ref 9–46)
AST: 28 U/L (ref 10–35)
Albumin: 4.3 g/dL (ref 3.6–5.1)
Alkaline phosphatase (APISO): 65 U/L (ref 35–144)
BUN: 10 mg/dL (ref 7–25)
CO2: 25 mmol/L (ref 20–32)
Calcium: 9.4 mg/dL (ref 8.6–10.3)
Chloride: 105 mmol/L (ref 98–110)
Creat: 0.94 mg/dL (ref 0.70–1.33)
GFR, Est African American: 105 mL/min/{1.73_m2} (ref >=60)
GFR, Est Non African American: 90 mL/min/{1.73_m2} (ref >=60)
Globulin: 2.4 g/dL (calc) (ref 1.9–3.7)
Glucose, Bld: 116 mg/dL — ABNORMAL HIGH (ref 65–99)
Potassium: 4.2 mmol/L (ref 3.5–5.3)
Sodium: 140 mmol/L (ref 135–146)
Total Bilirubin: 0.9 mg/dL (ref 0.2–1.2)
Total Protein: 6.7 g/dL (ref 6.1–8.1)

## 2018-11-24 LAB — HEMOGLOBIN A1C
Hgb A1c MFr Bld: 6.4 % of total Hgb — ABNORMAL HIGH (ref <5.7)
Mean Plasma Glucose: 137 (calc)
eAG (mmol/L): 7.6 (calc)

## 2018-11-24 LAB — URIC ACID: Uric Acid, Serum: 8.4 mg/dL — ABNORMAL HIGH (ref 4.0–8.0)

## 2018-11-30 ENCOUNTER — Ambulatory Visit (INDEPENDENT_AMBULATORY_CARE_PROVIDER_SITE_OTHER): Payer: BLUE CROSS/BLUE SHIELD | Admitting: Family Medicine

## 2018-11-30 ENCOUNTER — Other Ambulatory Visit: Payer: Self-pay | Admitting: Family Medicine

## 2018-11-30 ENCOUNTER — Encounter: Payer: Self-pay | Admitting: Family Medicine

## 2018-11-30 VITALS — BP 134/80 | HR 95 | Temp 98.1°F | Resp 16 | Ht 71.0 in | Wt 209.0 lb

## 2018-11-30 DIAGNOSIS — Z125 Encounter for screening for malignant neoplasm of prostate: Secondary | ICD-10-CM

## 2018-11-30 DIAGNOSIS — M722 Plantar fascial fibromatosis: Secondary | ICD-10-CM | POA: Diagnosis not present

## 2018-11-30 DIAGNOSIS — R7303 Prediabetes: Secondary | ICD-10-CM

## 2018-11-30 DIAGNOSIS — E782 Mixed hyperlipidemia: Secondary | ICD-10-CM

## 2018-11-30 DIAGNOSIS — Z0000 Encounter for general adult medical examination without abnormal findings: Secondary | ICD-10-CM

## 2018-11-30 DIAGNOSIS — M1000 Idiopathic gout, unspecified site: Secondary | ICD-10-CM

## 2018-11-30 DIAGNOSIS — F410 Panic disorder [episodic paroxysmal anxiety] without agoraphobia: Secondary | ICD-10-CM

## 2018-11-30 DIAGNOSIS — K219 Gastro-esophageal reflux disease without esophagitis: Secondary | ICD-10-CM

## 2018-11-30 DIAGNOSIS — K429 Umbilical hernia without obstruction or gangrene: Secondary | ICD-10-CM

## 2018-11-30 DIAGNOSIS — F411 Generalized anxiety disorder: Secondary | ICD-10-CM

## 2018-11-30 MED ORDER — NAPROXEN 500 MG PO TABS: 500.0000 mg | ORAL_TABLET | Freq: Two times a day (BID) | ORAL | 2 refills | Status: DC

## 2018-11-30 NOTE — Patient Instructions (Addendum)
Thank you for coming to the office today.  Referral to General Surgery is recommended for Umbilical Hernia, currently without complication. Let me know which surgeon / office you prefer in [facility] and we can refer you - and they will make recommendations.   Check on location / name - Podiatrist in [place] - we can refer for Plantar Fasciitis  -------------  Call if need more colchicine > brand name now is Mitigare we can send this if you are ready  ------  RE ordered Naproxen - use as needed for foot pain.   DUE for FASTING BLOOD WORK (no food or drink after midnight before the lab appointment, only water or coffee without cream/sugar on the morning of)  SCHEDULE "Lab Only" visit in the morning at the clinic for lab draw in 7 MONTHS   - Make sure Lab Only appointment is at about 1 week before your next appointment, so that results will be available  For Lab Results, once available within 2-3 days of blood draw, you can can log in to MyChart online to view your results and a brief explanation. Also, we can discuss results at next follow-up visit.   Please schedule a Follow-up Appointment to: Return in about 7 months (around 07/01/2019) for Annual Physical.  If you have any other questions or concerns, please feel free to call the office or send a message through [place]. You may also schedule an earlier appointment if necessary.  Additionally, you may be receiving a survey about your experience at our office within a few days to 1 week by e-mail or mail. We value your feedback.  Nobie Putnam, DO [place]

## 2018-11-30 NOTE — Assessment & Plan Note (Signed)
Clinically with mild chronic umbilical hernia without complication No obstruction, incarceration or actual worsening herniation  Agree with 2nd opinion refer to gen surgery for eval to determine if candidate for preventative surgery  Await request of surgeon in local [place] area - he will call and we can place referral when ready

## 2018-11-30 NOTE — Assessment & Plan Note (Signed)
Significantly elevated A1c still at 6.4, prior 6.3, poor diet Concern with HLD  Plan:  1. Continue current metformin 500mg  BID - has recently restarted this < 1 month ago 2. Encourage improved lifestyle - low carb, low sugar diet, reduce portion size, continue improving regular exercise 3. Follow-up 6 months for annual and labs

## 2018-11-30 NOTE — Assessment & Plan Note (Addendum)
Stable without flare recently Improved uric acid from 9.7 down to 8.4 Not on uric acid lowering therapy currently, has declined allopurinol On NSAID PRN Has colchicine PRN flare - future will need mitigare brand name due to lack of coverage for colchicine  Re-check uric acid in 6 months

## 2018-11-30 NOTE — Assessment & Plan Note (Signed)
Improved LDL, still uncontrolled LDL >140 Off statin  Encourage improve lifestyle diet exercise Restart statin, re-check lipids 6 months

## 2018-11-30 NOTE — Progress Notes (Addendum)
Subjective:    Patient ID: Alec Hurst, male    DOB: May 29, 1961, 58 y.o.   MRN: [id number]  Alec Hurst is a 58 y.o. male presenting on 11/30/2018 for Hyperlipidemia   HPI   Pre-Diabetes Prior reading elevated to A1c 6.3. Now recent lab up to 6.4. He admits poor diet. CBGs:Not checking CBG Meds:Never on meds Currentlynot on ACEi/ARB Lifestyle: - Diet (eating more carbs/ sugar) - Exercise (working on improving regular exercise. Limited by foot and back) Denies hypoglycemia, polyuria, visual changes, numbness or tingling.  HYPERLIPIDEMIA: Last lipid panel 11/2018, still elevated but improved LDL 160 to 140s, mild elevated TG - Currently not taking Atorvastatin 20mg   Umbilical Hernia Reports chronic problem for while, he has had it evaluated before, with slight bulging or hernia of umbilicus, he was recently noticing more discomfort to pressure in this area, but has not had actual herniation out that required to be pushed back in. He request 2nd opinion gen surgery and consider repair to prevent a future problem. He would like to see surgeon in [place]  Chronic Gout Last gout lvl Uric acid 9.7 (06/2018). Now recent lab shows improve to 8.4, he has declined to take allopurinol. He has colchicine PRN but has not had any gout flares recently.  Bilateral plantar fasciitis Reports chronic issue with sharp stabbing pain in L > R foot. He has symptoms episodic worsening if on feet more, he tries stretching, and other conservative therapy, improved on naproxen, request refill. Also interested in referral to podiatry in [place] inserts   Depression screen [facility] 2/9 11/30/2018 09/28/2018 07/01/2018  Decreased Interest 0 0 0  Down, Depressed, Hopeless 0 0 0  PHQ - 2 Score 0 0 0  Altered sleeping - - 0  Tired, decreased energy - - 0  Change in appetite - - 0  Feeling bad or failure about yourself  - - 0  Trouble concentrating - - 0  Moving slowly or fidgety/restless - - 0    Suicidal thoughts - - 0  PHQ-9 Score - - 0  Difficult doing work/chores - - Not difficult at all    Social History   Tobacco Use  . Smoking status: Never Smoker  . Smokeless tobacco: Never Used  Substance Use Topics  . Alcohol use: Yes    Alcohol/week: 0.0 standard drinks    Comment: occasional  . Drug use: No    Review of Systems Per HPI unless specifically indicated above     Objective:    BP 134/80 (BP Location: Left Arm, Cuff Size: Normal)   Pulse 95   Temp 98.1 F (36.7 C) (Oral)   Resp 16   Ht 5\' 11"  (1.803 m)   Wt 209 lb (94.8 kg)   BMI 29.15 kg/m   Wt Readings from Last 3 Encounters:  11/30/18 209 lb (94.8 kg)  09/28/18 206 lb (93.4 kg)  07/01/18 208 lb 9.6 oz (94.6 kg)    Physical Exam Vitals signs and nursing note reviewed.  Constitutional:      General: He is not in acute distress.    Appearance: He is well-developed. He is not diaphoretic.     Comments: Well-appearing, comfortable, cooperative  HENT:     Head: Normocephalic and atraumatic.  Eyes:     General:        Right eye: No discharge.        Left eye: No discharge.     Conjunctiva/sclera: Conjunctivae normal.  Cardiovascular:  Rate and Rhythm: Normal rate.  Pulmonary:     Effort: Pulmonary effort is normal.  Abdominal:     General: Bowel sounds are normal. There is no distension.     Palpations: Abdomen is soft. There is no mass.     Tenderness: There is no abdominal tenderness.     Hernia: A hernia (small umbilical hernia palpable on exam without significant change on valsalva / inc abdominal pressure, some mlid discomfort with direct pressure, no incarceration) is present.  Musculoskeletal:     Comments: Left Foot Inspection: mildly high arch pes cavus, bunion formation  Palpation:currently non tender but localized area mid foot and heel with area of prior tenderness ROM: full active Strength: distal intact Neurovascular: distal intact  Skin:    General: Skin is warm and dry.      Findings: No erythema or rash.  Neurological:     Mental Status: He is alert and oriented to person, place, and time.  Psychiatric:        Behavior: Behavior normal.     Comments: Well groomed, good eye contact, normal speech and thoughts    Recent Labs    07/01/18 1140 11/23/18 0944  HGBA1C 6.3* 6.4*    Results for orders placed or performed in visit on 11/23/18  Uric acid  Result Value Ref Range   Uric Acid, Serum 8.4 (H) 4.0 - 8.0 mg/dL  Hemoglobin A1c  Result Value Ref Range   Hgb A1c MFr Bld 6.4 (H) <5.7 % of total Hgb   Mean Plasma Glucose 137 (calc)   eAG (mmol/L) 7.6 (calc)  Lipid panel  Result Value Ref Range   Cholesterol 224 (H) <200 mg/dL   HDL 48 > OR = 40 mg/dL   Triglycerides 171 (H) <150 mg/dL   LDL Cholesterol (Calc) 145 (H) mg/dL (calc)   Total CHOL/HDL Ratio 4.7 <5.0 (calc)   Non-HDL Cholesterol (Calc) 176 (H) <130 mg/dL (calc)  COMPLETE METABOLIC PANEL WITH GFR  Result Value Ref Range   Glucose, Bld 116 (H) 65 - 99 mg/dL   BUN 10 7 - 25 mg/dL   Creat 0.94 0.70 - 1.33 mg/dL   GFR, Est Non African American 90 > OR = 60 mL/min/1.35m2   GFR, Est African American 105 > OR = 60 mL/min/1.79m2   BUN/Creatinine Ratio NOT APPLICABLE 6 - 22 (calc)   Sodium 140 135 - 146 mmol/L   Potassium 4.2 3.5 - 5.3 mmol/L   Chloride 105 98 - 110 mmol/L   CO2 25 20 - 32 mmol/L   Calcium 9.4 8.6 - 10.3 mg/dL   Total Protein 6.7 6.1 - 8.1 g/dL   Albumin 4.3 3.6 - 5.1 g/dL   Globulin 2.4 1.9 - 3.7 g/dL (calc)   AG Ratio 1.8 1.0 - 2.5 (calc)   Total Bilirubin 0.9 0.2 - 1.2 mg/dL   Alkaline phosphatase (APISO) 65 35 - 144 U/L   AST 28 10 - 35 U/L   ALT 42 9 - 46 U/L      Assessment & Plan:   Problem List Items Addressed This Visit    Gout    Stable without flare recently Improved uric acid from 9.7 down to 8.4 Not on uric acid lowering therapy currently, has declined allopurinol On NSAID PRN Has colchicine PRN flare - future will need mitigare brand name due  to lack of coverage for colchicine  Re-check uric acid in 6 months      Relevant Medications   MITIGARE 0.6 MG  CAPS   Hyperlipidemia    Improved LDL, still uncontrolled LDL >140 Off statin  Encourage improve lifestyle diet exercise Restart statin, re-check lipids 6 months      Pre-diabetes - Primary    Significantly elevated A1c still at 6.4, prior 6.3, poor diet Concern with HLD  Plan:  1. Continue current metformin 500mg  BID - has recently restarted this < 1 month ago 2. Encourage improved lifestyle - low carb, low sugar diet, reduce portion size, continue improving regular exercise 3. Follow-up 6 months for annual and labs      Umbilical hernia    Clinically with mild chronic umbilical hernia without complication No obstruction, incarceration or actual worsening herniation  Agree with 2nd opinion refer to gen surgery for eval to determine if candidate for preventative surgery  Await request of surgeon in local [place] area - he will call and we can place referral when ready       Other Visit Diagnoses    Plantar fasciitis of left foot       Relevant Medications   naproxen (NAPROSYN) 500 MG tablet    Persistent plantar fasciitis, with improvement on NSAID and stretching / rest However given persistent symptoms, agree to refer to Podiatry of choice in [place] ordered this encounter  Medications  . naproxen (NAPROSYN) 500 MG tablet    Sig: Take 1 tablet (500 mg total) by mouth 2 (two) times daily with a meal. For 2-4 weeks then as needed    Dispense:  60 tablet    Refill:  2    Follow up plan: Return in about 7 months (around 07/01/2019) for Annual Physical.  Future labs ordered for 06/29/19  Nobie Putnam, [facility] 11/30/2018, 1:55 PM

## 2018-12-01 ENCOUNTER — Telehealth: Payer: Self-pay | Admitting: Family Medicine

## 2018-12-01 DIAGNOSIS — K429 Umbilical hernia without obstruction or gangrene: Secondary | ICD-10-CM

## 2018-12-01 NOTE — Telephone Encounter (Signed)
From discussion yesterday 2/10 - see note - umbilical hernia, uncomplicated - Referral sent to Dr Janeice Robinson [facility] see info below. Patient requesting consultation from gen surgery  [place]. [address] [place], [place] [address] Phone [phone] Fax [phone]  Nobie Putnam, [facility] 12/01/2018, 11:57 AM

## 2018-12-01 NOTE — Telephone Encounter (Signed)
Referral : Janeice Robinson  Fax #: [phone]  [facility]  [address]

## 2018-12-14 ENCOUNTER — Telehealth: Payer: Self-pay | Admitting: Family Medicine

## 2018-12-14 DIAGNOSIS — B853 Phthiriasis: Secondary | ICD-10-CM

## 2018-12-14 MED ORDER — PERMETHRIN 5 % EX CREA: TOPICAL_CREAM | CUTANEOUS | 0 refills | Status: DC

## 2018-12-14 NOTE — Telephone Encounter (Signed)
Refill sent  Alec Hurst, [facility] 12/14/2018, 11:31 AM

## 2018-12-14 NOTE — Telephone Encounter (Signed)
Pt. Called requesting refill on Elimite 5 % called into [facility]  [place]. Pt  Call back  [phone]

## 2018-12-21 DIAGNOSIS — K429 Umbilical hernia without obstruction or gangrene: Secondary | ICD-10-CM | POA: Diagnosis not present

## 2019-01-26 DIAGNOSIS — N5203 Combined arterial insufficiency and corporo-venous occlusive erectile dysfunction: Secondary | ICD-10-CM | POA: Diagnosis not present

## 2019-06-01 DIAGNOSIS — D235 Other benign neoplasm of skin of trunk: Secondary | ICD-10-CM | POA: Diagnosis not present

## 2019-06-01 DIAGNOSIS — L820 Inflamed seborrheic keratosis: Secondary | ICD-10-CM | POA: Diagnosis not present

## 2019-06-01 DIAGNOSIS — L570 Actinic keratosis: Secondary | ICD-10-CM | POA: Diagnosis not present

## 2019-06-01 DIAGNOSIS — L821 Other seborrheic keratosis: Secondary | ICD-10-CM | POA: Diagnosis not present

## 2019-06-01 DIAGNOSIS — L573 Poikiloderma of Civatte: Secondary | ICD-10-CM | POA: Diagnosis not present

## 2019-06-01 DIAGNOSIS — L218 Other seborrheic dermatitis: Secondary | ICD-10-CM | POA: Diagnosis not present

## 2019-06-04 ENCOUNTER — Other Ambulatory Visit: Payer: Self-pay | Admitting: Family Medicine

## 2019-06-04 DIAGNOSIS — B853 Phthiriasis: Secondary | ICD-10-CM

## 2019-06-29 ENCOUNTER — Other Ambulatory Visit: Payer: BLUE CROSS/BLUE SHIELD

## 2019-07-05 ENCOUNTER — Ambulatory Visit (INDEPENDENT_AMBULATORY_CARE_PROVIDER_SITE_OTHER): Payer: BC Managed Care – PPO | Admitting: Family Medicine

## 2019-07-05 ENCOUNTER — Other Ambulatory Visit: Payer: Self-pay | Admitting: Family Medicine

## 2019-07-05 ENCOUNTER — Encounter: Payer: Self-pay | Admitting: Family Medicine

## 2019-07-05 ENCOUNTER — Other Ambulatory Visit: Payer: Self-pay

## 2019-07-05 VITALS — BP 141/85 | HR 68 | Temp 98.2°F | Resp 16 | Ht 71.0 in | Wt 210.0 lb

## 2019-07-05 DIAGNOSIS — Z23 Encounter for immunization: Secondary | ICD-10-CM

## 2019-07-05 DIAGNOSIS — Z20828 Contact with and (suspected) exposure to other viral communicable diseases: Secondary | ICD-10-CM

## 2019-07-05 DIAGNOSIS — Z125 Encounter for screening for malignant neoplasm of prostate: Secondary | ICD-10-CM | POA: Diagnosis not present

## 2019-07-05 DIAGNOSIS — F410 Panic disorder [episodic paroxysmal anxiety] without agoraphobia: Secondary | ICD-10-CM

## 2019-07-05 DIAGNOSIS — R7303 Prediabetes: Secondary | ICD-10-CM

## 2019-07-05 DIAGNOSIS — Z7252 High risk homosexual behavior: Secondary | ICD-10-CM

## 2019-07-05 DIAGNOSIS — Z0000 Encounter for general adult medical examination without abnormal findings: Secondary | ICD-10-CM

## 2019-07-05 DIAGNOSIS — B853 Phthiriasis: Secondary | ICD-10-CM

## 2019-07-05 DIAGNOSIS — Z20822 Contact with and (suspected) exposure to covid-19: Secondary | ICD-10-CM

## 2019-07-05 DIAGNOSIS — E782 Mixed hyperlipidemia: Secondary | ICD-10-CM

## 2019-07-05 DIAGNOSIS — F411 Generalized anxiety disorder: Secondary | ICD-10-CM

## 2019-07-05 DIAGNOSIS — K219 Gastro-esophageal reflux disease without esophagitis: Secondary | ICD-10-CM

## 2019-07-05 MED ORDER — METFORMIN HCL 500 MG PO TABS: 500.0000 mg | ORAL_TABLET | Freq: Two times a day (BID) | ORAL | 1 refills | Status: DC

## 2019-07-05 MED ORDER — OMEPRAZOLE 20 MG PO CPDR: 20.0000 mg | DELAYED_RELEASE_CAPSULE | Freq: Every day | ORAL | 1 refills | Status: DC

## 2019-07-05 MED ORDER — PERMETHRIN 5 % EX CREA: TOPICAL_CREAM | CUTANEOUS | 1 refills | Status: DC

## 2019-07-05 NOTE — Patient Instructions (Addendum)
Thank you for coming to the office today.  Flu shot today  Added HIV screening, added COVID19 screening Stay tuned for results.  Colonoscopy due 07/2020  WIll order the cream as requested.  Stay tuned for results, if sugar elevated or other concern can discuss again sooner within 6 months  Added TSH thyroid  Refilled meds   Please schedule a Follow-up Appointment to: Return in about 1 year (around 07/04/2020) for Annual Physical.  If you have any other questions or concerns, please feel free to call the office or send a message through [place]. You may also schedule an earlier appointment if necessary.  Additionally, you may be receiving a survey about your experience at our office within a few days to 1 week by e-mail or mail. We value your feedback.  Nobie Putnam, DO [place]

## 2019-07-06 ENCOUNTER — Encounter: Payer: Self-pay | Admitting: Family Medicine

## 2019-07-06 LAB — CBC WITH DIFFERENTIAL/PLATELET
Absolute Monocytes: 710 cells/uL (ref 200–950)
Basophils Absolute: 67 cells/uL (ref 0–200)
Basophils Relative: 1 %
Eosinophils Absolute: 80 cells/uL (ref 15–500)
Eosinophils Relative: 1.2 %
HCT: 47.5 % (ref 38.5–50.0)
Hemoglobin: 16.3 g/dL (ref 13.2–17.1)
Lymphs Abs: 1822 cells/uL (ref 850–3900)
MCH: 33.2 pg — ABNORMAL HIGH (ref 27.0–33.0)
MCHC: 34.3 g/dL (ref 32.0–36.0)
MCV: 96.7 fL (ref 80.0–100.0)
MPV: 11.4 fL (ref 7.5–12.5)
Monocytes Relative: 10.6 %
Neutro Abs: 4020 cells/uL (ref 1500–7800)
Neutrophils Relative %: 60 %
Platelets: 247 10*3/uL (ref 140–400)
RBC: 4.91 10*6/uL (ref 4.20–5.80)
RDW: 12.6 % (ref 11.0–15.0)
Total Lymphocyte: 27.2 %
WBC: 6.7 10*3/uL (ref 3.8–10.8)

## 2019-07-06 LAB — LIPID PANEL
Cholesterol: 222 mg/dL — ABNORMAL HIGH (ref <200)
HDL: 43 mg/dL (ref >=40)
LDL Cholesterol (Calc): 148 mg/dL (calc) — ABNORMAL HIGH
Non-HDL Cholesterol (Calc): 179 mg/dL (calc) — ABNORMAL HIGH (ref <130)
Total CHOL/HDL Ratio: 5.2 (calc) — ABNORMAL HIGH (ref <5.0)
Triglycerides: 173 mg/dL — ABNORMAL HIGH (ref <150)

## 2019-07-06 LAB — HEMOGLOBIN A1C
Hgb A1c MFr Bld: 6.2 % of total Hgb — ABNORMAL HIGH (ref <5.7)
Mean Plasma Glucose: 131 (calc)
eAG (mmol/L): 7.3 (calc)

## 2019-07-06 LAB — COMPLETE METABOLIC PANEL WITH GFR
AG Ratio: 1.8 (calc) (ref 1.0–2.5)
ALT: 60 U/L — ABNORMAL HIGH (ref 9–46)
AST: 39 U/L — ABNORMAL HIGH (ref 10–35)
Albumin: 4.1 g/dL (ref 3.6–5.1)
Alkaline phosphatase (APISO): 54 U/L (ref 35–144)
BUN: 16 mg/dL (ref 7–25)
CO2: 24 mmol/L (ref 20–32)
Calcium: 9.2 mg/dL (ref 8.6–10.3)
Chloride: 107 mmol/L (ref 98–110)
Creat: 1.02 mg/dL (ref 0.70–1.33)
GFR, Est African American: 94 mL/min/{1.73_m2} (ref >=60)
GFR, Est Non African American: 81 mL/min/{1.73_m2} (ref >=60)
Globulin: 2.3 g/dL (calc) (ref 1.9–3.7)
Glucose, Bld: 133 mg/dL — ABNORMAL HIGH (ref 65–99)
Potassium: 4.1 mmol/L (ref 3.5–5.3)
Sodium: 142 mmol/L (ref 135–146)
Total Bilirubin: 0.4 mg/dL (ref 0.2–1.2)
Total Protein: 6.4 g/dL (ref 6.1–8.1)

## 2019-07-06 LAB — HIV ANTIBODY (ROUTINE TESTING W REFLEX): HIV 1&2 Ab, 4th Generation: NONREACTIVE

## 2019-07-06 LAB — URIC ACID: Uric Acid, Serum: 9.6 mg/dL — ABNORMAL HIGH (ref 4.0–8.0)

## 2019-07-06 LAB — TSH: TSH: 2.23 mIU/L (ref 0.40–4.50)

## 2019-07-06 LAB — PSA: PSA: 0.5 ng/mL (ref <=4.0)

## 2019-07-06 LAB — SAR COV2 SEROLOGY (COVID19)AB(IGG),IA: SARS CoV2 AB IGG: NEGATIVE

## 2019-07-06 NOTE — Assessment & Plan Note (Signed)
Homosexual male MSM, unprotected anal intercourse Check routine HIV screen with labs Routine precautions given for safe sex

## 2019-07-06 NOTE — Assessment & Plan Note (Signed)
Due lipid panel Previously uncontrolled Off statin 

## 2019-07-06 NOTE — Assessment & Plan Note (Signed)
Consistent with chronic GAD with panic Seems more performance related Not endorsing depression has resolved Some history of insomnia - Failed SSRI due to sexual dysfunction - No prior Psych / counseling  Plan: 1. Continue propranolol daily PRN only  Follow-up as needed if not improved anxiety, med adjust, GAD7/PHQ9

## 2019-07-06 NOTE — Assessment & Plan Note (Signed)
Significantly elevated A1c previously up to 6.4 At risk of T2DM Concern with HLD  Plan:  Check A1c  1. Continue current metformin 500mg  BID 2. Encourage improved lifestyle - low carb, low sugar diet, reduce portion size, continue improving regular exercise 3. Follow-up 6 to 12 mo pending results

## 2019-07-06 NOTE — Progress Notes (Signed)
Subjective:    Patient ID: Alec Hurst, male    DOB: 10/22/1960, 58 y.o.   MRN: [id number]  Alec Hurst is a 58 y.o. male presenting on 07/05/2019 for Annual Exam   HPI   Here for Annual Physical and due for fasting labs today  Pre-Diabetes Prior readings elevated lately 6.3 to 6.4 last (11/2018) Due now CBGs:Not checking CBG Meds:Metformin 500 BID Currentlynot on ACEi/ARB Lifestyle: - Diet (balanced diet, no particular diet plan)  - Exercise (working on improving regular exercise)  HYPERLIPIDEMIA: - Reports no concerns. Last lipid panel 11/2018, elevated readings. Due for lipids now, fasting today. - Currently taking Atorvastatin 20mg , tolerating well without side effects or myalgias  High Risk Sexual Behavior / MSM Patient is requesting updated routine HIV screen today, last negative 03/2018. He is homosexual and unprotected anal intercourse without condoms with male partner. 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  - He takes Propranolol 10mg  daily PRN for performance anxiety, otherwise not taking regularly or other meds - Currently doing well   Additional complaints today:  GERD Reports he is taking Omeprazole 20mg  PRN mostly in evening only if certain diet high acidic foods. Recently had severe episode of heartburn, persisted for several hours, chest and upper abdominal pain, did not have any other associated symptoms, it subsided. - Today need refill Omeparzole Denies dark stool or blood in stool, nausea vomiting, chest pain, dyspnea, nausea vomiting    Health Maintenance:  Due for Flu Shot, declines today despite counseling on benefits  Colon CA Screening: Last Colonoscopy negative (done by [facility]  07/2015), results with 2 polyps tubular adenoma, benign repeat in 5 years. Currently asymptomatic. Some fam history not at young age.  Next due 10/21  Due for Flu Shot, will receive today    Depression screen [facility] 2/9 07/05/2019 11/30/2018 09/28/2018  Decreased Interest 0 0 0  Down, Depressed, Hopeless 0 0 0  PHQ - 2 Score 0 0 0  Altered sleeping - - -  Tired, decreased energy - - -  Change in appetite - - -  Feeling bad or failure about yourself  - - -  Trouble concentrating - - -  Moving slowly or fidgety/restless - - -  Suicidal thoughts - - -  PHQ-9 Score - - -  Difficult doing work/chores - - -    Past Medical History:  Diagnosis Date  . Calculus of kidney   . Delayed ejaculation   . ED (erectile dysfunction)   . Hx of colonic polyps   . Impotence, organic   . Malaise and fatigue   . Panic disorder with agoraphobia   . Sciatica    Past Surgical History:  Procedure Laterality Date  . LITHOTRIPSY     Social History   Socioeconomic History  . Marital status: Soil scientist    Spouse name: Not on file  . Number of children: Not on file  . Years of education: Not on file  . Highest education level: Not on file  Occupational History  . Occupation: Musician Copywriter, advertising, Photographer)  Social Needs  . Financial resource strain: Not on file  . Food insecurity    Worry: Not on file    Inability: Not on file  . Transportation needs  Medical: Not on file    Non-medical: Not on file  Tobacco Use  . Smoking status: Never Smoker  . Smokeless tobacco: Never Used  Substance and Sexual Activity  . Alcohol use: Yes    Alcohol/week: 0.0 standard drinks    Comment: occasional  . Drug use: No  . Sexual activity: Not on file  Lifestyle  . Physical activity    Days per week: Not on file    Minutes per session: Not on file  . Stress: Not on file  Relationships  . Social Herbalist on phone: Not on file    Gets together: Not on file    Attends religious  service: Not on file    Active member of club or organization: Not on file    Attends meetings of clubs or organizations: Not on file    Relationship status: Not on file  . Intimate partner violence    Fear of current or ex partner: Not on file    Emotionally abused: Not on file    Physically abused: Not on file    Forced sexual activity: Not on file  Other Topics Concern  . Not on file  Social History Narrative  . Not on file   Family History  Problem Relation Age of Onset  . Colon cancer Maternal Aunt   . Other Mother        rx med addiction before passing   Current Outpatient Medications on File Prior to Visit  Medication Sig  . Melatonin 5 MG TABS Take 5 mg by mouth at bedtime as needed.  Marland Kitchen MITIGARE 0.6 MG CAPS Take only if gout flare - start with 2 tablets then may take 1 tab 2 hours later, then 1 twice as needed until flare resolves  . naproxen (NAPROSYN) 500 MG tablet Take 1 tablet (500 mg total) by mouth 2 (two) times daily with a meal. For 2-4 weeks then as needed  . propranolol (INDERAL) 10 MG tablet Take 1 tablet (10 mg total) by mouth daily as needed (performance anxiety, panic).  . sildenafil (REVATIO) 20 MG tablet Take 20 mg by mouth 3 (three) times daily.  . sucralfate (CARAFATE) 1 g tablet TAKE 1 TABLET(1 GRAM) BY MOUTH FOUR TIMES DAILY AT BEDTIME AND WITH MEALS AS NEEDED FOR ACID REFLUX  . ketoconazole (NIZORAL) 2 % shampoo Apply 1 application topically daily as needed for irritation. Up to 1 week as needed for flare (Patient not taking: Reported on 07/05/2019)   No current facility-administered medications on file prior to visit.     Review of Systems  Constitutional: Negative for activity change, appetite change, chills, diaphoresis, fatigue and fever.  HENT: Negative for congestion and hearing loss.   Eyes: Negative for visual disturbance.  Respiratory: Negative for apnea, cough, chest tightness, shortness of breath and wheezing.   Cardiovascular: Negative for  chest pain, palpitations and leg swelling.  Gastrointestinal: Negative for abdominal pain, constipation, diarrhea, nausea and vomiting.  Endocrine: Negative for cold intolerance.  Genitourinary: Negative for decreased urine volume, difficulty urinating, discharge, dysuria, frequency, hematuria, scrotal swelling, testicular pain and urgency.  Musculoskeletal: Negative for arthralgias and neck pain.  Skin: Positive for rash.  Allergic/Immunologic: Negative for environmental allergies.  Neurological: Negative for dizziness, weakness, light-headedness, numbness and headaches.  Hematological: Negative for adenopathy.  Psychiatric/Behavioral: Negative for behavioral problems, dysphoric mood and sleep disturbance.   Per HPI unless specifically indicated above      Objective:    BP Marland Kitchen)  141/85   Pulse 68   Temp 98.2 F (36.8 C) (Oral)   Resp 16   Ht 5\' 11"  (1.803 m)   Wt 210 lb (95.3 kg)   BMI 29.29 kg/m   Wt Readings from Last 3 Encounters:  07/05/19 210 lb (95.3 kg)  11/30/18 209 lb (94.8 kg)  09/28/18 206 lb (93.4 kg)    Physical Exam Vitals signs and nursing note reviewed.  Constitutional:      General: He is not in acute distress.    Appearance: He is well-developed. He is not diaphoretic.     Comments: Well-appearing, comfortable, cooperative  HENT:     Head: Normocephalic and atraumatic.  Eyes:     General:        Right eye: No discharge.        Left eye: No discharge.     Conjunctiva/sclera: Conjunctivae normal.     Pupils: Pupils are equal, round, and reactive to light.  Neck:     Musculoskeletal: Normal range of motion and neck supple.     Thyroid: No thyromegaly.  Cardiovascular:     Rate and Rhythm: Normal rate and regular rhythm.     Heart sounds: Normal heart sounds. No murmur.  Pulmonary:     Effort: Pulmonary effort is normal. No respiratory distress.     Breath sounds: Normal breath sounds. No wheezing or rales.  Abdominal:     General: Bowel sounds are  normal. There is no distension.     Palpations: Abdomen is soft. There is no mass.     Tenderness: There is no abdominal tenderness.  Musculoskeletal: Normal range of motion.        General: No tenderness.     Comments: Upper / Lower Extremities: - Normal muscle tone, strength bilateral upper extremities 5/5, lower extremities 5/5  Lymphadenopathy:     Cervical: No cervical adenopathy.  Skin:    General: Skin is warm and dry.     Findings: No erythema or rash.  Neurological:     Mental Status: He is alert and oriented to person, place, and time.     Comments: Distal sensation intact to light touch all extremities  Psychiatric:        Behavior: Behavior normal.     Comments: Well groomed, good eye contact, normal speech and thoughts    Results for orders placed or performed in visit on 06/29/19  Uric acid  Result Value Ref Range   Uric Acid, Serum 9.6 (H) 4.0 - 8.0 mg/dL  PSA  Result Value Ref Range   PSA 0.5 < OR = 4.0 ng/mL  Lipid panel  Result Value Ref Range   Cholesterol 222 (H) <200 mg/dL   HDL 43 > OR = 40 mg/dL   Triglycerides 173 (H) <150 mg/dL   LDL Cholesterol (Calc) 148 (H) mg/dL (calc)   Total CHOL/HDL Ratio 5.2 (H) <5.0 (calc)   Non-HDL Cholesterol (Calc) 179 (H) <130 mg/dL (calc)  COMPLETE METABOLIC PANEL WITH GFR  Result Value Ref Range   Glucose, Bld 133 (H) 65 - 99 mg/dL   BUN 16 7 - 25 mg/dL   Creat 1.02 0.70 - 1.33 mg/dL   GFR, Est Non African American 81 > OR = 60 mL/min/1.44m2   GFR, Est African American 94 > OR = 60 mL/min/1.40m2   BUN/Creatinine Ratio NOT APPLICABLE 6 - 22 (calc)   Sodium 142 135 - 146 mmol/L   Potassium 4.1 3.5 - 5.3 mmol/L   Chloride 107 98 -  110 mmol/L   CO2 24 20 - 32 mmol/L   Calcium 9.2 8.6 - 10.3 mg/dL   Total Protein 6.4 6.1 - 8.1 g/dL   Albumin 4.1 3.6 - 5.1 g/dL   Globulin 2.3 1.9 - 3.7 g/dL (calc)   AG Ratio 1.8 1.0 - 2.5 (calc)   Total Bilirubin 0.4 0.2 - 1.2 mg/dL   Alkaline phosphatase (APISO) 54 35 - 144 U/L    AST 39 (H) 10 - 35 U/L   ALT 60 (H) 9 - 46 U/L  CBC with Differential/Platelet  Result Value Ref Range   WBC 6.7 3.8 - 10.8 Thousand/uL   RBC 4.91 4.20 - 5.80 Million/uL   Hemoglobin 16.3 13.2 - 17.1 g/dL   HCT 47.5 38.5 - 50.0 %   MCV 96.7 80.0 - 100.0 fL   MCH 33.2 (H) 27.0 - 33.0 pg   MCHC 34.3 32.0 - 36.0 g/dL   RDW 12.6 11.0 - 15.0 %   Platelets 247 140 - 400 Thousand/uL   MPV 11.4 7.5 - 12.5 fL   Neutro Abs 4,020 1,500 - 7,800 cells/uL   Lymphs Abs 1,822 850 - 3,900 cells/uL   Absolute Monocytes 710 200 - 950 cells/uL   Eosinophils Absolute 80 15 - 500 cells/uL   Basophils Absolute 67 0 - 200 cells/uL   Neutrophils Relative % 60 %   Total Lymphocyte 27.2 %   Monocytes Relative 10.6 %   Eosinophils Relative 1.2 %   Basophils Relative 1.0 %  TSH  Result Value Ref Range   TSH 2.23 0.40 - 4.50 mIU/L      Assessment & Plan:   Problem List Items Addressed This Visit    Generalized anxiety disorder with panic attacks    Consistent with chronic GAD with panic Seems more performance related Not endorsing depression has resolved Some history of insomnia - Failed SSRI due to sexual dysfunction - No prior Psych / counseling  Plan: 1. Continue propranolol daily PRN only  Follow-up as needed if not improved anxiety, med adjust, GAD7/PHQ9      GERD without esophagitis    Stable controlled on intermittent PPI Re order omeprazole      Relevant Medications   omeprazole (PRILOSEC) 20 MG capsule   High risk sexual behavior    Homosexual male MSM, unprotected anal intercourse Check routine HIV screen with labs Routine precautions given for safe sex      Relevant Orders   HIV Antibody (routine testing w rflx)   Hyperlipidemia    Due lipid panel Previously uncontrolled Off statin       Relevant Orders   TSH   Pre-diabetes    Significantly elevated A1c previously up to 6.4 At risk of T2DM Concern with HLD  Plan:  Check A1c  1. Continue current metformin  500mg  BID 2. Encourage improved lifestyle - low carb, low sugar diet, reduce portion size, continue improving regular exercise 3. Follow-up 6 to 12 mo pending results      Relevant Medications   metFORMIN (GLUCOPHAGE) 500 MG tablet   Screening for prostate cancer    Other Visit Diagnoses    Annual physical exam    -  Primary   Exposure to Covid-19 Virus       Relevant Orders   SAR CoV2 Serology (COVID 19)AB(IGG)IA   Needs flu shot       Relevant Orders   Flu Vaccine QUAD 36+ mos IM (Completed)      Updated Health Maintenance information -flu shot  today -check COVID19 antibody by request after discussion, prior illness to try to determine previously exposed to [place], discussed limitations of this test Reviewed recent lab results with patient Encouraged improvement to lifestyle with diet and exercise - Goal of weight loss   Meds ordered this encounter  Medications  . omeprazole (PRILOSEC) 20 MG capsule    Sig: Take 1 capsule (20 mg total) by mouth daily before breakfast.    Dispense:  90 capsule    Refill:  1  . metFORMIN (GLUCOPHAGE) 500 MG tablet    Sig: Take 1 tablet (500 mg total) by mouth 2 (two) times daily with a meal.    Dispense:  180 tablet    Refill:  1      Follow up plan: Return in about 1 year (around 07/04/2020) for Annual Physical.  Nobie Putnam, DO [facility] 07/05/2019, 9:09 AM

## 2019-07-06 NOTE — Assessment & Plan Note (Signed)
Stable controlled on intermittent PPI Re order omeprazole

## 2019-10-05 ENCOUNTER — Telehealth: Payer: Self-pay

## 2019-10-05 DIAGNOSIS — M1000 Idiopathic gout, unspecified site: Secondary | ICD-10-CM

## 2019-10-05 MED ORDER — PREDNISONE 10 MG PO TABS: ORAL_TABLET | ORAL | 0 refills | Status: DC

## 2019-10-05 NOTE — Telephone Encounter (Signed)
Patient informed. 

## 2019-10-05 NOTE — Telephone Encounter (Signed)
Typically generic colchicine is not covered, but we ordered it last time as brand name Mitigare - usually that brand name or the other brand Colcrys could be covered.  For now though I would offer a prednisone taper over 8 days sent to the requested pharmacy, it [place].  He should follow back up with [place] when he returns if still having any problems with gout, usually would ask that he schedule apt for gout flare in future.  Nobie Putnam, DO [facility] 10/05/2019, 1:10 PM

## 2019-10-05 NOTE — Telephone Encounter (Signed)
Patient has gout flare up onset yesterday but colchicine is not covered through his insurance wants alternative send to [facility] [address] [place] [address].

## 2019-11-10 DIAGNOSIS — H11129 Conjunctival concretions, unspecified eye: Secondary | ICD-10-CM | POA: Diagnosis not present

## 2019-12-13 ENCOUNTER — Ambulatory Visit (INDEPENDENT_AMBULATORY_CARE_PROVIDER_SITE_OTHER): Payer: BC Managed Care – PPO | Admitting: Family Medicine

## 2019-12-13 ENCOUNTER — Other Ambulatory Visit: Payer: Self-pay

## 2019-12-13 ENCOUNTER — Ambulatory Visit: Payer: BC Managed Care – PPO | Admitting: Family Medicine

## 2019-12-13 ENCOUNTER — Encounter: Payer: Self-pay | Admitting: Family Medicine

## 2019-12-13 VITALS — BP 138/88 | HR 80 | Temp 97.8°F | Resp 16 | Ht 71.0 in | Wt 216.8 lb

## 2019-12-13 DIAGNOSIS — G8929 Other chronic pain: Secondary | ICD-10-CM

## 2019-12-13 DIAGNOSIS — K219 Gastro-esophageal reflux disease without esophagitis: Secondary | ICD-10-CM

## 2019-12-13 DIAGNOSIS — R0789 Other chest pain: Secondary | ICD-10-CM | POA: Diagnosis not present

## 2019-12-13 DIAGNOSIS — M5442 Lumbago with sciatica, left side: Secondary | ICD-10-CM | POA: Diagnosis not present

## 2019-12-13 NOTE — Patient Instructions (Addendum)
Thank you for coming to the office today.  Locate a cardiologist in your network / [place] and we can place referral once you are ready.  Goal is to do cardiovascular testing with ECHOcardiogram / Stress Testing.  If another episode, can take Aspirin  Also can try Sucralfate as needed for immediate relief  Omeprazole 20mg  can be increased to 40 in the future also can switch to PM dosing. Or twice a day if need  Let me know ASAP once you locate Cardiologist and also for back - recommend Neurosurgery.  ----------------------------------------------------------------------------------------  [place] COVID19 Vaccine Information  LOCATION:  [facility]) [place] [address]  Hours: Monday - Sunday 8:00am to 12:00pm  COVID-19 Vaccines By Appointment Only  Sign up for [place] List  [email or web address] or text "VACCINE" to [phone] or call [phone]  ------------------------  If you have any significant chest pain that does not go away within 30 minutes, is accompanied by nausea, sweating, shortness of breath, or made worse by activity, this may be evidence of a heart attack, especially if symptoms worsening instead of improving, please call 911 or go directly to the emergency room immediately for evaluation.     Please schedule a Follow-up Appointment to: Return if symptoms worsen or fail to improve, for chest pain / GERD.  If you have any other questions or concerns, please feel free to call the office or send a message through [facility]. You may also schedule an earlier appointment if necessary.  Additionally, you may be receiving a survey about your experience at our office within a few days to 1 week by e-mail or mail. We value your feedback.  Alec Putnam, DO [facility]

## 2019-12-13 NOTE — Progress Notes (Signed)
Subjective:    Patient ID: Alec Hurst, male    DOB: 01-05-1961, 59 y.o.   MRN: [id number]  Alec Hurst is a 59 y.o. male presenting on 12/13/2019 for Chest Pain (twice in month, onset 4 days lasted 3 hours at night, dizziness started at 1 am and lasted 4:30 am, chest tightness, throwing up)   HPI   Atypical Chest Pain / History of GERD  Known recurrence of same problem over past few years. 1st episode 2.5 years ago 2nd episode 1 month ago - same as last episode, woke up. 3rd episode 12/09/19  Describes most recent episode approx 1am on 12/09/19, he woke up from sleep due to upper chest tightness as he described, he did not have any esophageal symptoms, he had some slight shortness of breath - Admitted nausea vomiting episode 2 hours later - Did not take any medicines - Admits to having soreness in neck and upper back muscles mostly R neck trapezius areas - Admits dizziness - Admits sweating episode  History of GERD. He said this feels different, not having traditional "heartburn". He has carafate he takes PRN. Also takes Omeprazole 20mg  daily in AM with some relief but not taking higher doses No symptoms radiating in arms or tingling  Additional history  Lumbar Spine DJD / Chronic low back pain History of lumbar epidural injection Previously Neurosurgery at [facility] in [place] He is interested in repeat injection, he will check into location that did his last procedure.  Admits tired and fatigued / sleepy during day now more than usual.   Depression screen [facility] 2/9 07/05/2019 11/30/2018 09/28/2018  Decreased Interest 0 0 0  Down, Depressed, Hopeless 0 0 0  PHQ - 2 Score 0 0 0  Altered sleeping - - -  Tired, decreased energy - - -  Change in appetite - - -  Feeling bad or failure about yourself  - - -  Trouble concentrating - - -  Moving slowly or fidgety/restless - - -  Suicidal thoughts - - -  PHQ-9 Score - - -  Difficult doing work/chores - - -    Social History    Tobacco Use  . Smoking status: Never Smoker  . Smokeless tobacco: Never Used  Substance Use Topics  . Alcohol use: Yes    Alcohol/week: 0.0 standard drinks    Comment: occasional  . Drug use: No    Review of Systems Per HPI unless specifically indicated above     Objective:    BP 138/88 (BP Location: Left Arm, Cuff Size: Normal)   Pulse 80   Temp 97.8 F (36.6 C) (Temporal)   Resp 16   Ht 5\' 11"  (1.803 m)   Wt 216 lb 12.8 oz (98.3 kg)   SpO2 99%   BMI 30.24 kg/m   Wt Readings from Last 3 Encounters:  12/13/19 216 lb 12.8 oz (98.3 kg)  07/05/19 210 lb (95.3 kg)  11/30/18 209 lb (94.8 kg)    Physical Exam Vitals and nursing note reviewed.  Constitutional:      General: He is not in acute distress.    Appearance: He is well-developed. He is not diaphoretic.     Comments: Well-appearing, comfortable, cooperative  HENT:     Head: Normocephalic and atraumatic.  Eyes:     General:        Right eye: No discharge.        Left eye: No discharge.     Conjunctiva/sclera: Conjunctivae normal.  Neck:  Thyroid: No thyromegaly.  Cardiovascular:     Rate and Rhythm: Normal rate and regular rhythm.     Heart sounds: Normal heart sounds. No murmur.  Pulmonary:     Effort: Pulmonary effort is normal. No respiratory distress.     Breath sounds: Normal breath sounds. No wheezing or rales.  Chest:     Chest wall: No mass, deformity or tenderness.  Musculoskeletal:        General: Normal range of motion.     Cervical back: Normal range of motion and neck supple.  Lymphadenopathy:     Cervical: No cervical adenopathy.  Skin:    General: Skin is warm and dry.     Findings: No erythema or rash.  Neurological:     Mental Status: He is alert and oriented to person, place, and time.  Psychiatric:        Behavior: Behavior normal.     Comments: Well groomed, good eye contact, normal speech and thoughts        Results for orders placed or performed in visit on 06/29/19   Uric acid  Result Value Ref Range   Uric Acid, Serum 9.6 (H) 4.0 - 8.0 mg/dL  PSA  Result Value Ref Range   PSA 0.5 < OR = 4.0 ng/mL  Lipid panel  Result Value Ref Range   Cholesterol 222 (H) <200 mg/dL   HDL 43 > OR = 40 mg/dL   Triglycerides 173 (H) <150 mg/dL   LDL Cholesterol (Calc) 148 (H) mg/dL (calc)   Total CHOL/HDL Ratio 5.2 (H) <5.0 (calc)   Non-HDL Cholesterol (Calc) 179 (H) <130 mg/dL (calc)  COMPLETE METABOLIC PANEL WITH GFR  Result Value Ref Range   Glucose, Bld 133 (H) 65 - 99 mg/dL   BUN 16 7 - 25 mg/dL   Creat 1.02 0.70 - 1.33 mg/dL   GFR, Est Non African American 81 > OR = 60 mL/min/1.94m2   GFR, Est African American 94 > OR = 60 mL/min/1.3m2   BUN/Creatinine Ratio NOT APPLICABLE 6 - 22 (calc)   Sodium 142 135 - 146 mmol/L   Potassium 4.1 3.5 - 5.3 mmol/L   Chloride 107 98 - 110 mmol/L   CO2 24 20 - 32 mmol/L   Calcium 9.2 8.6 - 10.3 mg/dL   Total Protein 6.4 6.1 - 8.1 g/dL   Albumin 4.1 3.6 - 5.1 g/dL   Globulin 2.3 1.9 - 3.7 g/dL (calc)   AG Ratio 1.8 1.0 - 2.5 (calc)   Total Bilirubin 0.4 0.2 - 1.2 mg/dL   Alkaline phosphatase (APISO) 54 35 - 144 U/L   AST 39 (H) 10 - 35 U/L   ALT 60 (H) 9 - 46 U/L  CBC with Differential/Platelet  Result Value Ref Range   WBC 6.7 3.8 - 10.8 Thousand/uL   RBC 4.91 4.20 - 5.80 Million/uL   Hemoglobin 16.3 13.2 - 17.1 g/dL   HCT 47.5 38.5 - 50.0 %   MCV 96.7 80.0 - 100.0 fL   MCH 33.2 (H) 27.0 - 33.0 pg   MCHC 34.3 32.0 - 36.0 g/dL   RDW 12.6 11.0 - 15.0 %   Platelets 247 140 - 400 Thousand/uL   MPV 11.4 7.5 - 12.5 fL   Neutro Abs 4,020 1,500 - 7,800 cells/uL   Lymphs Abs 1,822 850 - 3,900 cells/uL   Absolute Monocytes 710 200 - 950 cells/uL   Eosinophils Absolute 80 15 - 500 cells/uL   Basophils Absolute 67 0 - 200 cells/uL  Neutrophils Relative % 60 %   Total Lymphocyte 27.2 %   Monocytes Relative 10.6 %   Eosinophils Relative 1.2 %   Basophils Relative 1.0 %  Hemoglobin A1c  Result Value Ref Range    Hgb A1c MFr Bld 6.2 (H) <5.7 % of total Hgb   Mean Plasma Glucose 131 (calc)   eAG (mmol/L) 7.3 (calc)  TSH  Result Value Ref Range   TSH 2.23 0.40 - 4.50 mIU/L  SAR CoV2 Serology (COVID 19)AB(IGG)IA  Result Value Ref Range   SARS CoV2 AB IGG NEGATIVE   HIV Antibody (routine testing w rflx)  Result Value Ref Range   HIV 1&2 Ab, 4th Generation NON-REACTIVE NON-REACTI      Assessment & Plan:   Problem List Items Addressed This Visit    GERD without esophagitis   Chronic left-sided low back pain with left-sided sciatica    Other Visit Diagnoses    Atypical chest pain    -  Primary     Clinically with atypical chest pain episodes, infrequent but now significant increased occurrence x 2 in 1 month, last episode last week. Constellation of symptoms severe with waking up from sleep, chest pain, dyspnea, nausea vomiting, diaphoresis. See HPI above, very mixed features without evidence of exertional component. - Still suggestive of known GERD can have flare up related to this  - However, cannot rule out ACS or Cardiovascular etiology. - He has not had cardiac work up. Has some remote fam history paternal uncle age 103 passed w/ MI  Plan - Previously recommended last week when he contacted our office to go to hospital ED/Urgent Care immediately for chest pain symptoms. He declined to go. But preferred outpatient follow-up - Today discussion on concern for his episodic symptoms, and now next step is pursue cardiac work-up - Offered referral today to Cardiology, he will request to be seen by Cardiologist in [facility] - through his network, he will contact us back with specific information on which office he prefers - Will anticipate he will benefit from complete work up stress test / ECHO / EKG and other evaluation  He may use Carafate PRN. He should continue Omeprazole we can adjust this in future if ultimately determine it is GERD related.  Strict return criteria reviewed, when to go to  hospital ED - if recurrence  Additionally with chronic low back pain - known Lumbar DJD - he will notify us of last Neurosurgery office in [place] and we can refer if need for epidural injection re-evaluation   No orders of the defined types were placed in this encounter.     Follow up plan: Return if symptoms worsen or fail to improve, for chest pain / GERD.   Nobie Putnam, [facility] 12/13/2019, 1:55 PM

## 2019-12-21 DIAGNOSIS — L218 Other seborrheic dermatitis: Secondary | ICD-10-CM | POA: Diagnosis not present

## 2019-12-21 DIAGNOSIS — L738 Other specified follicular disorders: Secondary | ICD-10-CM | POA: Diagnosis not present

## 2019-12-21 DIAGNOSIS — L821 Other seborrheic keratosis: Secondary | ICD-10-CM | POA: Diagnosis not present

## 2019-12-23 DIAGNOSIS — K429 Umbilical hernia without obstruction or gangrene: Secondary | ICD-10-CM | POA: Diagnosis not present

## 2019-12-24 DIAGNOSIS — Z23 Encounter for immunization: Secondary | ICD-10-CM | POA: Diagnosis not present

## 2020-01-03 DIAGNOSIS — R0789 Other chest pain: Secondary | ICD-10-CM | POA: Diagnosis not present

## 2020-01-03 DIAGNOSIS — I480 Paroxysmal atrial fibrillation: Secondary | ICD-10-CM | POA: Insufficient documentation

## 2020-01-03 DIAGNOSIS — Z7901 Long term (current) use of anticoagulants: Secondary | ICD-10-CM | POA: Diagnosis not present

## 2020-01-03 DIAGNOSIS — R0683 Snoring: Secondary | ICD-10-CM | POA: Diagnosis not present

## 2020-01-03 DIAGNOSIS — E785 Hyperlipidemia, unspecified: Secondary | ICD-10-CM | POA: Diagnosis not present

## 2020-01-03 DIAGNOSIS — Z79899 Other long term (current) drug therapy: Secondary | ICD-10-CM | POA: Diagnosis not present

## 2020-01-03 DIAGNOSIS — R9431 Abnormal electrocardiogram [ECG] [EKG]: Secondary | ICD-10-CM | POA: Diagnosis not present

## 2020-01-03 DIAGNOSIS — K219 Gastro-esophageal reflux disease without esophagitis: Secondary | ICD-10-CM | POA: Diagnosis not present

## 2020-01-03 DIAGNOSIS — I340 Nonrheumatic mitral (valve) insufficiency: Secondary | ICD-10-CM | POA: Diagnosis not present

## 2020-01-03 DIAGNOSIS — I10 Essential (primary) hypertension: Secondary | ICD-10-CM | POA: Diagnosis not present

## 2020-01-03 DIAGNOSIS — K429 Umbilical hernia without obstruction or gangrene: Secondary | ICD-10-CM | POA: Diagnosis not present

## 2020-01-03 DIAGNOSIS — Z8249 Family history of ischemic heart disease and other diseases of the circulatory system: Secondary | ICD-10-CM | POA: Diagnosis not present

## 2020-01-03 DIAGNOSIS — Z01812 Encounter for preprocedural laboratory examination: Secondary | ICD-10-CM | POA: Diagnosis not present

## 2020-01-08 DIAGNOSIS — Z20822 Contact with and (suspected) exposure to covid-19: Secondary | ICD-10-CM | POA: Diagnosis not present

## 2020-01-08 DIAGNOSIS — Z01812 Encounter for preprocedural laboratory examination: Secondary | ICD-10-CM | POA: Diagnosis not present

## 2020-01-08 DIAGNOSIS — I480 Paroxysmal atrial fibrillation: Secondary | ICD-10-CM | POA: Diagnosis not present

## 2020-01-10 DIAGNOSIS — R0789 Other chest pain: Secondary | ICD-10-CM | POA: Diagnosis not present

## 2020-01-10 DIAGNOSIS — I700 Atherosclerosis of aorta: Secondary | ICD-10-CM | POA: Diagnosis not present

## 2020-01-10 DIAGNOSIS — I480 Paroxysmal atrial fibrillation: Secondary | ICD-10-CM | POA: Diagnosis not present

## 2020-01-10 DIAGNOSIS — I340 Nonrheumatic mitral (valve) insufficiency: Secondary | ICD-10-CM | POA: Diagnosis not present

## 2020-01-10 DIAGNOSIS — E785 Hyperlipidemia, unspecified: Secondary | ICD-10-CM | POA: Diagnosis not present

## 2020-01-13 DIAGNOSIS — Z23 Encounter for immunization: Secondary | ICD-10-CM | POA: Diagnosis not present

## 2020-01-17 ENCOUNTER — Other Ambulatory Visit: Payer: Self-pay

## 2020-01-17 ENCOUNTER — Telehealth: Payer: Self-pay | Admitting: Family Medicine

## 2020-01-17 ENCOUNTER — Ambulatory Visit (INDEPENDENT_AMBULATORY_CARE_PROVIDER_SITE_OTHER): Payer: BC Managed Care – PPO | Admitting: Family Medicine

## 2020-01-17 ENCOUNTER — Encounter: Payer: Self-pay | Admitting: Family Medicine

## 2020-01-17 ENCOUNTER — Ambulatory Visit: Payer: BC Managed Care – PPO | Admitting: Family Medicine

## 2020-01-17 DIAGNOSIS — I480 Paroxysmal atrial fibrillation: Secondary | ICD-10-CM | POA: Diagnosis not present

## 2020-01-17 DIAGNOSIS — M1009 Idiopathic gout, multiple sites: Secondary | ICD-10-CM | POA: Diagnosis not present

## 2020-01-17 MED ORDER — COLCHICINE 0.6 MG PO TABS: ORAL_TABLET | ORAL | 3 refills | Status: DC

## 2020-01-17 NOTE — Progress Notes (Signed)
Virtual Visit via Telephone The purpose of this virtual visit is to provide medical care while limiting exposure to the novel coronavirus (COVID19) for both patient and office staff.  Consent was obtained for phone visit:  Yes.   Answered questions that patient had about telehealth interaction:  Yes.   I discussed the limitations, risks, security and privacy concerns of performing an evaluation and management service by telephone. I also discussed with the patient that there may be a patient responsible charge related to this service. The patient expressed understanding and agreed to proceed.  Patient Location: Home Provider Location: [facility])  ---------------------------------------------------------------------- Chief Complaint  Patient presents with  . Gout    refill for Colchicine    S: Reviewed CMA documentation. I have called patient and gathered additional HPI as follows:  Acute on Chronic Gout, with recurrent flares Prior history with elevated uric acid levels in 2019 and 2020 with 8-9 range. Last 06/2019. Has declined allopurinol in past. Previously was on colchicine PRN flare, then his insurance didn't cover it now insurance changed and they do cover needs new rx generic colchicine, almost out of med. Recent history gout flare x 2 in past 3 months approximately. He would normally take colchicine PRn flare with 2 tablets by mouth, may repeat 1 tablet 2 hours later, then 1 tab daily - he admits acute gout flare in past 24 hours, started colchicine needs new order, similar to prior flares - he wants to keep working on diet to prevent gout for now Denies any other joint pain or swelling or injury  Additional updates  Paroxysmal Atrial Fibrillation - followed by [facility], started on Eliquis 5mg  BID and Metoprolol 25mg  XL daily, and future TEE Cardioversion.  Denies any high risk travel to areas of current concern for COVID19. Denies any known or  suspected exposure to person with or possibly with COVID19.   Denies any fevers, chills, sweats, body ache, cough, shortness of breath, sinus pain or pressure, headache, abdominal pain, diarrhea  Past Medical History:  Diagnosis Date  . Calculus of kidney   . Delayed ejaculation   . ED (erectile dysfunction)   . Hx of colonic polyps   . Impotence, organic   . Malaise and fatigue   . Panic disorder with agoraphobia   . Sciatica    Social History   Tobacco Use  . Smoking status: Never Smoker  . Smokeless tobacco: Never Used  Substance Use Topics  . Alcohol use: Yes    Alcohol/week: 0.0 standard drinks    Comment: occasional  . Drug use: No    Current Outpatient Medications:  .  ketoconazole (NIZORAL) 2 % shampoo, Apply 1 application topically daily as needed for irritation. Up to 1 week as needed for flare, Disp: 120 mL, Rfl: 1 .  LOTEMAX SM 0.38 % GEL, INSTILL 1 DROP INTO BOTH EYES THREE TIMES DAILY FOR 1WEEK, Disp: , Rfl:  .  Melatonin 5 MG TABS, Take 5 mg by mouth at bedtime as needed., Disp: , Rfl:  .  metFORMIN (GLUCOPHAGE) 500 MG tablet, Take 1 tablet (500 mg total) by mouth 2 (two) times daily with a meal., Disp: 180 tablet, Rfl: 1 .  naproxen (NAPROSYN) 500 MG tablet, Take 1 tablet (500 mg total) by mouth 2 (two) times daily with a meal. For 2-4 weeks then as needed, Disp: 60 tablet, Rfl: 2 .  omeprazole (PRILOSEC) 20 MG capsule, Take 1 capsule (20 mg total) by mouth daily before  breakfast., Disp: 90 capsule, Rfl: 1 .  permethrin (ELIMITE) 5 % cream, APPLY TO WHOLE BODY, FROM HEAD TO TOE AT BEDTIME, LEAVE ON FOR 8 TO 12 HOURS( OVERNIGHT), WASH OFF NEXT DAY. REPEAT IN 14 DAYS AS NEEDED, Disp: 60 g, Rfl: 1 .  propranolol (INDERAL) 10 MG tablet, Take 1 tablet (10 mg total) by mouth daily as needed (performance anxiety, panic)., Disp: 30 tablet, Rfl: 3 .  sucralfate (CARAFATE) 1 g tablet, TAKE 1 TABLET(1 GRAM) BY MOUTH FOUR TIMES DAILY AT BEDTIME AND WITH MEALS AS NEEDED FOR  ACID REFLUX, Disp: 360 tablet, Rfl: 0 .  colchicine 0.6 MG tablet, Start with 2 pills if gout flare, may repeat 1 pill in 2 hours, then can take 1 pill daily for up to 7-10 days for gout flare, Disp: 30 tablet, Rfl: 3 .  ELIQUIS 5 MG TABS tablet, Take 5 mg by mouth 2 (two) times daily., Disp: , Rfl:  .  metoprolol succinate (TOPROL-XL) 25 MG 24 hr tablet, Take 25 mg by mouth daily., Disp: , Rfl:  .  sildenafil (REVATIO) 20 MG tablet, Take 20 mg by mouth 3 (three) times daily., Disp: , Rfl:   Depression screen [facility] 2/9 01/17/2020 07/05/2019 11/30/2018  Decreased Interest 0 0 0  Down, Depressed, Hopeless 0 0 0  PHQ - 2 Score 0 0 0  Altered sleeping - - -  Tired, decreased energy - - -  Change in appetite - - -  Feeling bad or failure about yourself  - - -  Trouble concentrating - - -  Moving slowly or fidgety/restless - - -  Suicidal thoughts - - -  PHQ-9 Score - - -  Difficult doing work/chores - - -    GAD 7 : Generalized Anxiety Score 05/28/2017  Nervous, Anxious, on Edge 3  Control/stop worrying 3  Worry too much - different things 3  Trouble relaxing 3  Restless 0  Easily annoyed or irritable 3  Afraid - awful might happen 3  Total GAD 7 Score 18  Anxiety Difficulty Somewhat difficult    -------------------------------------------------------------------------- O: No physical exam performed due to remote telephone encounter.  Lab results reviewed.  No results found for this or any previous visit (from the past 2160 hour(s)).  -------------------------------------------------------------------------- A&P:  Problem List Items Addressed This Visit    Paroxysmal atrial fibrillation ([place])    Followed by [facility] Cardiology Had prior symptomatic episode Afib Now on Eliquis anticoagulation and metoprolol XL rate control until TEE Cardioversion      Relevant Medications   ELIQUIS 5 MG TABS tablet   metoprolol succinate (TOPROL-XL) 25 MG 24 hr tablet   Gout - Primary    Acute on  chronic gout flare Previously uric acid 9s (06/2019) Not on uric acid lowering therapy currently, has declined allopurinol  On NSAID PRN  Re order Colchicine generic 0.6mg  - x 2 first dose, repeat 1 in 2 hours then daily. New rx sent now covered.  Follow-up future uric acid repeat reconsider uric acid lowering therapy      Relevant Medications   colchicine 0.6 MG tablet      Meds ordered this encounter  Medications  . colchicine 0.6 MG tablet    Sig: Start with 2 pills if gout flare, may repeat 1 pill in 2 hours, then can take 1 pill daily for up to 7-10 days for gout flare    Dispense:  30 tablet    Refill:  3    Follow-up: -  Return as scheduled  Patient verbalizes understanding with the above medical recommendations including the limitation of remote medical advice.  Specific follow-up and call-back criteria were given for patient to follow-up or seek medical care more urgently if needed.   - Time spent in direct consultation with patient on phone: 9 minutes   Nobie Putnam, [facility] 01/17/2020, 2:28 PM

## 2020-01-17 NOTE — Patient Instructions (Signed)
Ordered Colchicine take as prescribed for acute gout flares  If unresolved within 72 hours or more can call and we can offer steroid or other therapy options for gout  In future can re-check uric acid and reconsider medications to prevent gout  Follow-up w cardiology as advised  If you have any other questions or concerns, please feel free to call the office or send a message through [place]. You may also schedule an earlier appointment if necessary.  Additionally, you may be receiving a survey about your experience at our office within a few days to 1 week by e-mail or mail. We value your feedback.  Nobie Putnam, DO [place]

## 2020-01-17 NOTE — Assessment & Plan Note (Signed)
Followed by [facility] Cardiology Had prior symptomatic episode Afib Now on Eliquis anticoagulation and metoprolol XL rate control until TEE Cardioversion

## 2020-01-17 NOTE — Assessment & Plan Note (Signed)
Acute on chronic gout flare Previously uric acid 9s (06/2019) Not on uric acid lowering therapy currently, has declined allopurinol  On NSAID PRN  Re order Colchicine generic 0.6mg  - x 2 first dose, repeat 1 in 2 hours then daily. New rx sent now covered.  Follow-up future uric acid repeat reconsider uric acid lowering therapy

## 2020-01-27 ENCOUNTER — Encounter: Payer: Self-pay | Admitting: Family Medicine

## 2020-01-27 DIAGNOSIS — N5203 Combined arterial insufficiency and corporo-venous occlusive erectile dysfunction: Secondary | ICD-10-CM | POA: Diagnosis not present

## 2020-01-27 DIAGNOSIS — Z125 Encounter for screening for malignant neoplasm of prostate: Secondary | ICD-10-CM | POA: Diagnosis not present

## 2020-01-27 DIAGNOSIS — N200 Calculus of kidney: Secondary | ICD-10-CM | POA: Diagnosis not present

## 2020-01-27 DIAGNOSIS — Z87442 Personal history of urinary calculi: Secondary | ICD-10-CM | POA: Insufficient documentation

## 2020-01-28 DIAGNOSIS — I480 Paroxysmal atrial fibrillation: Secondary | ICD-10-CM | POA: Diagnosis not present

## 2020-02-02 NOTE — Telephone Encounter (Signed)
error 

## 2020-02-03 DIAGNOSIS — M5137 Other intervertebral disc degeneration, lumbosacral region: Secondary | ICD-10-CM | POA: Diagnosis not present

## 2020-02-07 DIAGNOSIS — Z01818 Encounter for other preprocedural examination: Secondary | ICD-10-CM | POA: Diagnosis not present

## 2020-02-07 DIAGNOSIS — Z20822 Contact with and (suspected) exposure to covid-19: Secondary | ICD-10-CM | POA: Diagnosis not present

## 2020-02-08 DIAGNOSIS — M5137 Other intervertebral disc degeneration, lumbosacral region: Secondary | ICD-10-CM | POA: Diagnosis not present

## 2020-02-08 DIAGNOSIS — M4807 Spinal stenosis, lumbosacral region: Secondary | ICD-10-CM | POA: Diagnosis not present

## 2020-02-08 DIAGNOSIS — M545 Low back pain: Secondary | ICD-10-CM | POA: Diagnosis not present

## 2020-02-08 DIAGNOSIS — M5136 Other intervertebral disc degeneration, lumbar region: Secondary | ICD-10-CM | POA: Diagnosis not present

## 2020-02-08 DIAGNOSIS — M5127 Other intervertebral disc displacement, lumbosacral region: Secondary | ICD-10-CM | POA: Diagnosis not present

## 2020-02-09 DIAGNOSIS — K429 Umbilical hernia without obstruction or gangrene: Secondary | ICD-10-CM | POA: Diagnosis not present

## 2020-02-09 DIAGNOSIS — I4891 Unspecified atrial fibrillation: Secondary | ICD-10-CM | POA: Diagnosis not present

## 2020-02-09 DIAGNOSIS — K420 Umbilical hernia with obstruction, without gangrene: Secondary | ICD-10-CM | POA: Diagnosis not present

## 2020-02-09 DIAGNOSIS — M1990 Unspecified osteoarthritis, unspecified site: Secondary | ICD-10-CM | POA: Diagnosis not present

## 2020-02-14 DIAGNOSIS — M5137 Other intervertebral disc degeneration, lumbosacral region: Secondary | ICD-10-CM | POA: Diagnosis not present

## 2020-03-06 DIAGNOSIS — L218 Other seborrheic dermatitis: Secondary | ICD-10-CM | POA: Diagnosis not present

## 2020-03-06 DIAGNOSIS — L84 Corns and callosities: Secondary | ICD-10-CM | POA: Diagnosis not present

## 2020-04-07 DIAGNOSIS — M5137 Other intervertebral disc degeneration, lumbosacral region: Secondary | ICD-10-CM | POA: Diagnosis not present

## 2020-04-07 DIAGNOSIS — M5116 Intervertebral disc disorders with radiculopathy, lumbar region: Secondary | ICD-10-CM | POA: Diagnosis not present

## 2020-05-03 ENCOUNTER — Other Ambulatory Visit (HOSPITAL_COMMUNITY)
Admission: RE | Admit: 2020-05-03 | Discharge: 2020-05-03 | Disposition: A | Discharge status: Home / Self Care | Payer: BC Managed Care – PPO | Source: Ambulatory Visit | Attending: Family Medicine | Admitting: Family Medicine

## 2020-05-03 ENCOUNTER — Other Ambulatory Visit: Payer: Self-pay

## 2020-05-03 ENCOUNTER — Other Ambulatory Visit: Payer: BC Managed Care – PPO

## 2020-05-03 ENCOUNTER — Other Ambulatory Visit: Payer: Self-pay | Admitting: Family Medicine

## 2020-05-03 DIAGNOSIS — Z7252 High risk homosexual behavior: Secondary | ICD-10-CM | POA: Diagnosis not present

## 2020-05-04 LAB — HIV ANTIBODY (ROUTINE TESTING W REFLEX): HIV 1&2 Ab, 4th Generation: NONREACTIVE

## 2020-05-04 LAB — RPR: RPR Ser Ql: NONREACTIVE

## 2020-05-05 LAB — GC/CHLAMYDIA PROBE AMP (~~LOC~~) NOT AT ARMC
Chlamydia: NEGATIVE
Comment: NEGATIVE
Comment: NORMAL
Neisseria Gonorrhea: NEGATIVE

## 2020-05-22 DIAGNOSIS — I480 Paroxysmal atrial fibrillation: Secondary | ICD-10-CM | POA: Diagnosis not present

## 2020-06-03 DIAGNOSIS — Z20822 Contact with and (suspected) exposure to covid-19: Secondary | ICD-10-CM | POA: Diagnosis not present

## 2020-06-13 ENCOUNTER — Other Ambulatory Visit: Payer: Self-pay | Admitting: Family Medicine

## 2020-06-13 DIAGNOSIS — K219 Gastro-esophageal reflux disease without esophagitis: Secondary | ICD-10-CM

## 2020-06-13 NOTE — Telephone Encounter (Signed)
Requested Prescriptions  Pending Prescriptions Disp Refills  . omeprazole (PRILOSEC) 20 MG capsule [Pharmacy Med Name: OMEPRAZOLE 20MG  CAPSULES] 90 capsule 0    Sig: TAKE 1 CAPSULE(20 MG) BY MOUTH DAILY BEFORE BREAKFAST     Gastroenterology: Proton Pump Inhibitors Passed - 06/13/2020 10:44 AM      Passed - Valid encounter within last 12 months    Recent Outpatient Visits          4 months ago Idiopathic gout of multiple sites, unspecified chronicity   [place], DO   6 months ago Atypical chest pain   [facility] Olin Hauser, DO   11 months ago Annual physical exam   [facility] Olin Hauser, DO   1 year ago Pre-diabetes   [place], DO   1 year ago Pediculosis pubis   [facility] [place], Devonne Doughty, DO      Future Appointments            In 3 weeks [place], Devonne Doughty, [facility], [facility]

## 2020-06-30 ENCOUNTER — Telehealth: Payer: Self-pay | Admitting: Family Medicine

## 2020-06-30 DIAGNOSIS — R7303 Prediabetes: Secondary | ICD-10-CM

## 2020-06-30 DIAGNOSIS — I480 Paroxysmal atrial fibrillation: Secondary | ICD-10-CM

## 2020-06-30 DIAGNOSIS — Z125 Encounter for screening for malignant neoplasm of prostate: Secondary | ICD-10-CM

## 2020-06-30 DIAGNOSIS — M1009 Idiopathic gout, multiple sites: Secondary | ICD-10-CM

## 2020-06-30 DIAGNOSIS — Z0000 Encounter for general adult medical examination without abnormal findings: Secondary | ICD-10-CM

## 2020-06-30 DIAGNOSIS — E782 Mixed hyperlipidemia: Secondary | ICD-10-CM

## 2020-06-30 DIAGNOSIS — Z7252 High risk homosexual behavior: Secondary | ICD-10-CM

## 2020-06-30 NOTE — Telephone Encounter (Signed)
Signed orders, add routine HIV RPR STD Screening, Uric Acid.

## 2020-07-03 ENCOUNTER — Other Ambulatory Visit: Payer: Self-pay

## 2020-07-03 ENCOUNTER — Other Ambulatory Visit: Payer: BC Managed Care – PPO

## 2020-07-03 DIAGNOSIS — R7303 Prediabetes: Secondary | ICD-10-CM | POA: Diagnosis not present

## 2020-07-03 DIAGNOSIS — I480 Paroxysmal atrial fibrillation: Secondary | ICD-10-CM | POA: Diagnosis not present

## 2020-07-03 DIAGNOSIS — Z125 Encounter for screening for malignant neoplasm of prostate: Secondary | ICD-10-CM | POA: Diagnosis not present

## 2020-07-03 DIAGNOSIS — E782 Mixed hyperlipidemia: Secondary | ICD-10-CM | POA: Diagnosis not present

## 2020-07-03 DIAGNOSIS — Z7252 High risk homosexual behavior: Secondary | ICD-10-CM | POA: Diagnosis not present

## 2020-07-04 LAB — CBC WITH DIFFERENTIAL/PLATELET
Absolute Monocytes: 605 cells/uL (ref 200–950)
Basophils Absolute: 39 cells/uL (ref 0–200)
Basophils Relative: 0.7 %
Eosinophils Absolute: 72 cells/uL (ref 15–500)
Eosinophils Relative: 1.3 %
HCT: 49.4 % (ref 38.5–50.0)
Hemoglobin: 16.3 g/dL (ref 13.2–17.1)
Lymphs Abs: 1782 cells/uL (ref 850–3900)
MCH: 32.9 pg (ref 27.0–33.0)
MCHC: 33 g/dL (ref 32.0–36.0)
MCV: 99.8 fL (ref 80.0–100.0)
MPV: 10.5 fL (ref 7.5–12.5)
Monocytes Relative: 11 %
Neutro Abs: 3003 cells/uL (ref 1500–7800)
Neutrophils Relative %: 54.6 %
Platelets: 243 10*3/uL (ref 140–400)
RBC: 4.95 10*6/uL (ref 4.20–5.80)
RDW: 12.4 % (ref 11.0–15.0)
Total Lymphocyte: 32.4 %
WBC: 5.5 10*3/uL (ref 3.8–10.8)

## 2020-07-04 LAB — HIV ANTIBODY (ROUTINE TESTING W REFLEX): HIV 1&2 Ab, 4th Generation: NONREACTIVE

## 2020-07-04 LAB — COMPLETE METABOLIC PANEL WITH GFR
AG Ratio: 1.9 (calc) (ref 1.0–2.5)
ALT: 70 U/L — ABNORMAL HIGH (ref 9–46)
AST: 50 U/L — ABNORMAL HIGH (ref 10–35)
Albumin: 4.1 g/dL (ref 3.6–5.1)
Alkaline phosphatase (APISO): 57 U/L (ref 35–144)
BUN: 11 mg/dL (ref 7–25)
CO2: 26 mmol/L (ref 20–32)
Calcium: 9 mg/dL (ref 8.6–10.3)
Chloride: 107 mmol/L (ref 98–110)
Creat: 0.76 mg/dL (ref 0.70–1.33)
GFR, Est African American: 117 mL/min/{1.73_m2} (ref >=60)
GFR, Est Non African American: 101 mL/min/{1.73_m2} (ref >=60)
Globulin: 2.2 g/dL (calc) (ref 1.9–3.7)
Glucose, Bld: 131 mg/dL — ABNORMAL HIGH (ref 65–99)
Potassium: 4.2 mmol/L (ref 3.5–5.3)
Sodium: 141 mmol/L (ref 135–146)
Total Bilirubin: 0.7 mg/dL (ref 0.2–1.2)
Total Protein: 6.3 g/dL (ref 6.1–8.1)

## 2020-07-04 LAB — HEMOGLOBIN A1C
Hgb A1c MFr Bld: 6.9 % of total Hgb — ABNORMAL HIGH (ref <5.7)
Mean Plasma Glucose: 151 (calc)
eAG (mmol/L): 8.4 (calc)

## 2020-07-04 LAB — LIPID PANEL
Cholesterol: 203 mg/dL — ABNORMAL HIGH (ref <200)
HDL: 48 mg/dL (ref >=40)
LDL Cholesterol (Calc): 131 mg/dL (calc) — ABNORMAL HIGH
Non-HDL Cholesterol (Calc): 155 mg/dL (calc) — ABNORMAL HIGH (ref <130)
Total CHOL/HDL Ratio: 4.2 (calc) (ref <5.0)
Triglycerides: 125 mg/dL (ref <150)

## 2020-07-04 LAB — URIC ACID: Uric Acid, Serum: 8.1 mg/dL — ABNORMAL HIGH (ref 4.0–8.0)

## 2020-07-04 LAB — PSA: PSA: 0.5 ng/mL (ref <=4.0)

## 2020-07-04 LAB — RPR: RPR Ser Ql: NONREACTIVE

## 2020-07-10 ENCOUNTER — Encounter: Payer: BC Managed Care – PPO | Admitting: Family Medicine

## 2020-07-17 DIAGNOSIS — R0600 Dyspnea, unspecified: Secondary | ICD-10-CM | POA: Diagnosis not present

## 2020-07-17 DIAGNOSIS — Z136 Encounter for screening for cardiovascular disorders: Secondary | ICD-10-CM | POA: Diagnosis not present

## 2020-07-17 DIAGNOSIS — I4891 Unspecified atrial fibrillation: Secondary | ICD-10-CM | POA: Diagnosis not present

## 2020-07-24 ENCOUNTER — Ambulatory Visit (INDEPENDENT_AMBULATORY_CARE_PROVIDER_SITE_OTHER): Payer: BC Managed Care – PPO | Admitting: Family Medicine

## 2020-07-24 ENCOUNTER — Other Ambulatory Visit: Payer: Self-pay

## 2020-07-24 ENCOUNTER — Encounter: Payer: Self-pay | Admitting: Family Medicine

## 2020-07-24 VITALS — BP 142/93 | HR 91 | Temp 97.5°F | Resp 16 | Ht 71.0 in | Wt 216.0 lb

## 2020-07-24 DIAGNOSIS — Z0000 Encounter for general adult medical examination without abnormal findings: Secondary | ICD-10-CM

## 2020-07-24 DIAGNOSIS — I714 Abdominal aortic aneurysm, without rupture, unspecified: Secondary | ICD-10-CM

## 2020-07-24 DIAGNOSIS — Z1211 Encounter for screening for malignant neoplasm of colon: Secondary | ICD-10-CM

## 2020-07-24 DIAGNOSIS — Z23 Encounter for immunization: Secondary | ICD-10-CM | POA: Diagnosis not present

## 2020-07-24 DIAGNOSIS — E669 Obesity, unspecified: Secondary | ICD-10-CM | POA: Diagnosis not present

## 2020-07-24 DIAGNOSIS — E782 Mixed hyperlipidemia: Secondary | ICD-10-CM

## 2020-07-24 DIAGNOSIS — R7303 Prediabetes: Secondary | ICD-10-CM

## 2020-07-24 DIAGNOSIS — D126 Benign neoplasm of colon, unspecified: Secondary | ICD-10-CM

## 2020-07-24 DIAGNOSIS — I480 Paroxysmal atrial fibrillation: Secondary | ICD-10-CM

## 2020-07-24 DIAGNOSIS — K219 Gastro-esophageal reflux disease without esophagitis: Secondary | ICD-10-CM

## 2020-07-24 NOTE — Progress Notes (Signed)
Subjective:    Patient ID: Alec Hurst, male    DOB: 18-Dec-1960, 58 y.o.   MRN: [id number]  Alec Hurst is a 59 y.o. male presenting on 07/24/2020 for Annual Exam   HPI   Here for Annual Physical and Lab  Pre-Diabetes / Obesity BMI >30 Prior readings elevated lately 6.3 to 6.4 last (11/2018 Last reading up to A1c 6.9 on labs No prior >6.5 CBGs:Not checking CBG Meds:OFF Metformin 500 BID, he self discontinued Currentlynot on ACEi/ARB Lifestyle: - Diet (balanced diet, no particular diet plan)  - Exercise (working on improving regular exercise)  HYPERLIPIDEMIA: - Reports no concerns. Last lipid panel2/2020, elevated readings. Due for lipids now, fasting today. - Currently takingAtorvastatin 20mg , tolerating well without side effects or myalgias  High Risk Sexual Behavior / MSM Patient is requesting updated routine HIV screen today, last negative 03/2018. He is homosexual andunprotected anal intercourse without condoms with male partner. 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 - He takes Propranolol 10mg  daily PRN for performance anxiety, otherwise not taking regularly or other meds - Currently doing well  Abdominal Aorta Diltation/Aneurysm MRI Lumbar 01/2020 - had mild elevated AAA 2.6 cm They recommended- order Abd [place], screening yearly   spinal injection in low back spine with good results.  Health Maintenance:  Due for Flu Shot, will receive today   PSA Prostate Cancer Screening - 0.5, negative.  Colon CA Screening: 08/17/15 - [facility] Dr Vira Agar - polypx2 tubular adenoma, int hem, rpt 5 yr Now due for repeat , he will check with recommendation he asked for - [facility] and message with provider he prefers for  referral  Depression screen [facility] 2/9 07/24/2020 01/17/2020 07/05/2019  Decreased Interest 0 0 0  Down, Depressed, Hopeless 0 0 0  PHQ - 2 Score 0 0 0  Altered sleeping - - -  Tired, decreased energy - - -  Change in appetite - - -  Feeling bad or failure about yourself  - - -  Trouble concentrating - - -  Moving slowly or fidgety/restless - - -  Suicidal thoughts - - -  PHQ-9 Score - - -  Difficult doing work/chores - - -    Past Medical History:  Diagnosis Date  . Calculus of kidney   . Delayed ejaculation   . ED (erectile dysfunction)   . Hx of colonic polyps   . Impotence, organic   . Malaise and fatigue   . Panic disorder with agoraphobia   . Sciatica    Past Surgical History:  Procedure Laterality Date  . LITHOTRIPSY     Social History   Socioeconomic History  . Marital status: Soil scientist    Spouse name: Not on file  . Number of children: Not on file  . Years of education: Not on file  . Highest education level: Not on file  Occupational History  . Occupation: Musician Copywriter, advertising, Photographer)  Tobacco Use  . Smoking status: Never Smoker  . Smokeless tobacco: Never Used  Vaping Use  . Vaping Use: Never used  Substance and Sexual Activity  . Alcohol use: Yes    Alcohol/week: 0.0 standard drinks    Comment: occasional  . Drug use: No  . Sexual activity: Not on file  Other  Topics Concern  . Not on file  Social History Narrative  . Not on file   Social Determinants of Health   Financial Resource Strain:   . Difficulty of Paying Living Expenses: Not on file  Food Insecurity:   . Worried About Charity fundraiser in the Last Year: Not on file  . Ran Out of Food in the Last Year: Not on file  Transportation Needs:   . Lack of Transportation (Medical): Not on file  . Lack of Transportation (Non-Medical): Not on file  Physical Activity:   . Days of Exercise per Week: Not on file  . Minutes of Exercise per Session: Not on file  Stress:   . Feeling  of Stress : Not on file  Social Connections:   . Frequency of Communication with Friends and Family: Not on file  . Frequency of Social Gatherings with Friends and Family: Not on file  . Attends Religious Services: Not on file  . Active Member of Clubs or Organizations: Not on file  . Attends Archivist Meetings: Not on file  . Marital Status: Not on file  Intimate Partner Violence:   . Fear of Current or Ex-Partner: Not on file  . Emotionally Abused: Not on file  . Physically Abused: Not on file  . Sexually Abused: Not on file   Family History  Problem Relation Age of Onset  . Colon cancer Maternal Aunt   . Other Mother        rx med addiction before passing  . Heart attack Paternal Uncle 84   Current Outpatient Medications on File Prior to Visit  Medication Sig  . colchicine 0.6 MG tablet Start with 2 pills if gout flare, may repeat 1 pill in 2 hours, then can take 1 pill daily for up to 7-10 days for gout flare  . diazepam (VALIUM) 5 MG tablet Take 5 mg by mouth as directed.  Marland Kitchen ELIQUIS 5 MG TABS tablet Take 5 mg by mouth 2 (two) times daily.  Marland Kitchen ketoconazole (NIZORAL) 2 % shampoo Apply 1 application topically daily as needed for irritation. Up to 1 week as needed for flare  . LOTEMAX SM 0.38 % GEL INSTILL 1 DROP INTO BOTH EYES THREE TIMES DAILY FOR 1WEEK  . Melatonin 5 MG TABS Take 5 mg by mouth at bedtime as needed.  . metFORMIN (GLUCOPHAGE) 500 MG tablet Take 1 tablet (500 mg total) by mouth 2 (two) times daily with a meal.  . naproxen (NAPROSYN) 500 MG tablet Take 1 tablet (500 mg total) by mouth 2 (two) times daily with a meal. For 2-4 weeks then as needed  . omeprazole (PRILOSEC) 20 MG capsule TAKE 1 CAPSULE(20 MG) BY MOUTH DAILY BEFORE BREAKFAST  . permethrin (ELIMITE) 5 % cream APPLY TO WHOLE BODY, FROM HEAD TO TOE AT BEDTIME, LEAVE ON FOR 8 TO 12 HOURS( OVERNIGHT), WASH OFF NEXT DAY. REPEAT IN 14 DAYS AS NEEDED  . propranolol (INDERAL) 10 MG tablet Take 1 tablet  (10 mg total) by mouth daily as needed (performance anxiety, panic).  . sildenafil (REVATIO) 20 MG tablet Take 20 mg by mouth 3 (three) times daily.  . sucralfate (CARAFATE) 1 g tablet TAKE 1 TABLET(1 GRAM) BY MOUTH FOUR TIMES DAILY AT BEDTIME AND WITH MEALS AS NEEDED FOR ACID REFLUX  . metoprolol succinate (TOPROL-XL) 25 MG 24 hr tablet Take 25 mg by mouth daily. (Patient not taking: Reported on 07/24/2020)   No current facility-administered medications on file prior  to visit.    Review of Systems  Constitutional: Negative for activity change, appetite change, chills, diaphoresis, fatigue and fever.  HENT: Negative for congestion and hearing loss.   Eyes: Negative for visual disturbance.  Respiratory: Negative for apnea, cough, chest tightness, shortness of breath and wheezing.   Cardiovascular: Negative for chest pain, palpitations and leg swelling.  Gastrointestinal: Negative for abdominal pain, anal bleeding, blood in stool, constipation, diarrhea, nausea and vomiting.  Endocrine: Negative for cold intolerance.  Genitourinary: Negative for difficulty urinating, dysuria, frequency and hematuria.  Musculoskeletal: Negative for arthralgias, back pain and neck pain.  Skin: Negative for rash.  Allergic/Immunologic: Negative for environmental allergies.  Neurological: Negative for dizziness, weakness, light-headedness, numbness and headaches.  Hematological: Negative for adenopathy.  Psychiatric/Behavioral: Negative for behavioral problems, dysphoric mood and sleep disturbance. The patient is not nervous/anxious.    Per HPI unless specifically indicated above     Objective:    BP (!) 142/93   Pulse 91   Temp (!) 97.5 F (36.4 C) (Temporal)   Resp 16   Ht 5\' 11"  (1.803 m)   Wt 216 lb (98 kg)   SpO2 98%   BMI 30.13 kg/m   Wt Readings from Last 3 Encounters:  07/24/20 216 lb (98 kg)  12/13/19 216 lb 12.8 oz (98.3 kg)  07/05/19 210 lb (95.3 kg)    Physical Exam Vitals and  nursing note reviewed.  Constitutional:      General: He is not in acute distress.    Appearance: He is well-developed. He is not diaphoretic.     Comments: Well-appearing, comfortable, cooperative  HENT:     Head: Normocephalic and atraumatic.     Right Ear: Tympanic membrane and ear canal normal.     Left Ear: Tympanic membrane and ear canal normal.  Eyes:     General:        Right eye: No discharge.        Left eye: No discharge.     Conjunctiva/sclera: Conjunctivae normal.     Pupils: Pupils are equal, round, and reactive to light.  Neck:     Thyroid: No thyromegaly.     Vascular: No carotid bruit.  Cardiovascular:     Rate and Rhythm: Normal rate and regular rhythm.     Heart sounds: Normal heart sounds. No murmur heard.   Pulmonary:     Effort: Pulmonary effort is normal. No respiratory distress.     Breath sounds: Normal breath sounds. No wheezing or rales.  Abdominal:     General: Bowel sounds are normal. There is no distension.     Palpations: Abdomen is soft. There is no mass.     Tenderness: There is no abdominal tenderness.     Comments: No abdominal bruit heard  Musculoskeletal:        General: No tenderness. Normal range of motion.     Cervical back: Normal range of motion and neck supple.     Right lower leg: No edema.     Left lower leg: No edema.     Comments: Upper / Lower Extremities: - Normal muscle tone, strength bilateral upper extremities 5/5, lower extremities 5/5  Lymphadenopathy:     Cervical: No cervical adenopathy.  Skin:    General: Skin is warm and dry.     Findings: No erythema or rash.  Neurological:     Mental Status: He is alert and oriented to person, place, and time.     Comments: Distal sensation intact  to light touch all extremities  Psychiatric:        Behavior: Behavior normal.     Comments: Well groomed, good eye contact, normal speech and thoughts      Imaging from outside record, scanned in - [place] Lumbar MRI  spine 02/08/20 - one line of impression states: There is mild aneurysmal dilatation of the abdominal aorta, which measures 2.6 cm in diameter at the level of L3 inferior endplate. While probably not severe enough to warrant intervention at this time, the finding does warrant yearly ultrasound surveillance  Results for orders placed or performed in visit on 06/30/20  RPR  Result Value Ref Range   RPR Ser Ql NON-REACTIVE NON-REACTI  HIV Antibody (routine testing w rflx)  Result Value Ref Range   HIV 1&2 Ab, 4th Generation NON-REACTIVE NON-REACTI  Uric acid  Result Value Ref Range   Uric Acid, Serum 8.1 (H) 4.0 - 8.0 mg/dL  CBC with Differential/Platelet  Result Value Ref Range   WBC 5.5 3.8 - 10.8 Thousand/uL   RBC 4.95 4.20 - 5.80 Million/uL   Hemoglobin 16.3 13.2 - 17.1 g/dL   HCT 49.4 38 - 50 %   MCV 99.8 80.0 - 100.0 fL   MCH 32.9 27.0 - 33.0 pg   MCHC 33.0 32.0 - 36.0 g/dL   RDW 12.4 11.0 - 15.0 %   Platelets 243 140 - 400 Thousand/uL   MPV 10.5 7.5 - 12.5 fL   Neutro Abs 3,003 1,500 - 7,800 cells/uL   Lymphs Abs 1,782 850 - 3,900 cells/uL   Absolute Monocytes 605 200 - 950 cells/uL   Eosinophils Absolute 72 15 - 500 cells/uL   Basophils Absolute 39 0 - 200 cells/uL   Neutrophils Relative % 54.6 %   Total Lymphocyte 32.4 %   Monocytes Relative 11.0 %   Eosinophils Relative 1.3 %   Basophils Relative 0.7 %  Hemoglobin A1c  Result Value Ref Range   Hgb A1c MFr Bld 6.9 (H) <5.7 % of total Hgb   Mean Plasma Glucose 151 (calc)   eAG (mmol/L) 8.4 (calc)  Lipid panel  Result Value Ref Range   Cholesterol 203 (H) <200 mg/dL   HDL 48 > OR = 40 mg/dL   Triglycerides 125 <150 mg/dL   LDL Cholesterol (Calc) 131 (H) mg/dL (calc)   Total CHOL/HDL Ratio 4.2 <5.0 (calc)   Non-HDL Cholesterol (Calc) 155 (H) <130 mg/dL (calc)  PSA  Result Value Ref Range   PSA 0.50 < OR = 4.0 ng/mL  COMPLETE METABOLIC PANEL WITH GFR  Result Value Ref Range   Glucose, Bld 131 (H) 65 - 99 mg/dL    BUN 11 7 - 25 mg/dL   Creat 0.76 0.70 - 1.33 mg/dL   GFR, Est Non African American 101 > OR = 60 mL/min/1.63m2   GFR, Est African American 117 > OR = 60 mL/min/1.72m2   BUN/Creatinine Ratio NOT APPLICABLE 6 - 22 (calc)   Sodium 141 135 - 146 mmol/L   Potassium 4.2 3.5 - 5.3 mmol/L   Chloride 107 98 - 110 mmol/L   CO2 26 20 - 32 mmol/L   Calcium 9.0 8.6 - 10.3 mg/dL   Total Protein 6.3 6.1 - 8.1 g/dL   Albumin 4.1 3.6 - 5.1 g/dL   Globulin 2.2 1.9 - 3.7 g/dL (calc)   AG Ratio 1.9 1.0 - 2.5 (calc)   Total Bilirubin 0.7 0.2 - 1.2 mg/dL   Alkaline phosphatase (APISO) 57 35 - 144 U/L  AST 50 (H) 10 - 35 U/L   ALT 70 (H) 9 - 46 U/L      Assessment & Plan:   Problem List Items Addressed This Visit    Pre-diabetes    Significantly elevated A1c to 6.9, now >6.5 He is off Metformin At risk of T2DM Concern with HLD  Plan:  Discussed options with lifestyle overhaul, low carb, he should remain off metformin However if he is interested to pursue weight loss we can offer Wegovy if approved for management of Obesity in setting of a patient with PreDM - he will check cost/coverage and f/u with me in 4 weeks or so to discuss starting med  Encourage improved lifestyle - low carb, low sugar diet, reduce portion size, continue improving regular exercise      Paroxysmal atrial fibrillation (H[facility])    Followed by [facility] Cardiology Had prior symptomatic episode Afib on Eliquis anticoagulation      Obesity (BMI 30.0-34.9)    Abnormal weight BMI >30 Comorbid conditions with PreDM A1c at risk of T2DM Hyperlipidemia Elevated BP AFIB      Mixed hyperlipidemia   Abdominal aortic aneurysm (AAA) without rupture ([place])    Yearly surveillance AAA [place] ordered Last imaging MRI 01/24/20 lumbar [place] showed AAA 2.6 cm      Relevant Orders   US AORTA DUPLEX LIMITED    Other Visit Diagnoses    Annual physical exam    -  Primary   Needs flu shot       Relevant Orders   Flu Vaccine QUAD 36+  mos IM (Completed)   Screening for colon cancer          Updated Health Maintenance information - Refer to GI specialist for Colonoscopy when patient provides further information, he requested [facility] provider, waiting on recommendation for specific provider, since none closer to his home in [place] WIll refer once he messages me, he is due in 07/2020 after 5 years from last colonoscopy  Reviewed recent lab results with patient Encouraged improvement to lifestyle with diet and exercise - Goal of weight loss  Orders Placed This Encounter  Procedures  . US AORTA DUPLEX LIMITED    Standing Status:   Future    Standing Expiration Date:   01/22/2021    Order Specific Question:   Reason for Exam (SYMPTOM  OR DIAGNOSIS REQUIRED)    Answer:   AAA mild 2.6 cm, identified last imaging MRI lumbar spine 02/08/20 [place] outside image    Order Specific Question:   Preferred imaging location?    Answer:   [facility]  . Flu Vaccine QUAD 36+ mos IM     No orders of the defined types were placed in this encounter.     Follow up plan: Return in about 5 months (around 12/22/2020) for 5 month follow-up PreDM A1c.  Nobie Putnam, DO [facility] 07/24/2020, 1:56 PM

## 2020-07-24 NOTE — Assessment & Plan Note (Signed)
Yearly surveillance AAA [place] ordered Last imaging MRI 01/24/20 lumbar [place] showed AAA 2.6 cm

## 2020-07-24 NOTE — Assessment & Plan Note (Addendum)
Abnormal weight BMI >30 Comorbid conditions with PreDM A1c at risk of T2DM Hyperlipidemia Elevated BP AFIB

## 2020-07-24 NOTE — Assessment & Plan Note (Signed)
Significantly elevated A1c to 6.9, now >6.5 He is off Metformin At risk of T2DM Concern with HLD  Plan:  Discussed options with lifestyle overhaul, low carb, he should remain off metformin However if he is interested to pursue weight loss we can offer Wegovy if approved for management of Obesity in setting of a patient with PreDM - he will check cost/coverage and f/u with me in 4 weeks or so to discuss starting med  Encourage improved lifestyle - low carb, low sugar diet, reduce portion size, continue improving regular exercise

## 2020-07-24 NOTE — Assessment & Plan Note (Signed)
Followed by [facility] Had prior symptomatic episode Afib on Eliquis anticoagulation

## 2020-07-24 NOTE — Patient Instructions (Addendum)
Thank you for coming to the office today.  Recent Labs    07/03/20 0812  HGBA1C 6.9*   Goal to get A1c below 6.5, (threshold for Type 2 Diabetes) by next lab check.  In future if < 6.5 we can revisit the Metformin if need.  Last colonoscopy Dr Corlis Hove 08/17/15 - Next due 5 years, NOW in October or later 2021  Once you find the GI doctor you want, let me know in a mychart message name / details location of clinic etc and we can refer.  Future vascular ultrasound abdominal aorta mild aneurysm we can check yearly  -------------------------------  [email or web address]  We do have samples, check your insurance and cost / coverage first. It is a 1 weekly pen injection, dose increases gradually, wt loss appetite suppression, sugar control and heart protection  Dx ICD10 diagnosis codes - Obesity BMI >30 (E66.9), Pre-Diabetes (R73.03)  Alternative med is Kirke Shaggy (this is same thing but ONCE A DAY)  Please schedule a Follow-up Appointment to: Return in about 5 months (around 12/22/2020) for 5 month follow-up PreDM A1c.  If you have any other questions or concerns, please feel free to call the office or send a message through [place]. You may also schedule an earlier appointment if necessary.  Additionally, you may be receiving a survey about your experience at our office within a few days to 1 week by e-mail or mail. We value your feedback.  Nobie Putnam, DO [place]

## 2020-07-27 NOTE — Addendum Note (Signed)
Addended by: Olin Hauser on: 07/27/2020 05:17 PM   Modules accepted: Orders

## 2020-08-01 MED ORDER — OMEPRAZOLE 20 MG PO CPDR: DELAYED_RELEASE_CAPSULE | ORAL | 3 refills | Status: DC

## 2020-08-01 MED ORDER — SUCRALFATE 1 G PO TABS: ORAL_TABLET | ORAL | 3 refills | Status: DC

## 2020-08-01 NOTE — Addendum Note (Signed)
Addended by: Olin Hauser on: 08/01/2020 12:58 PM   Modules accepted: Orders

## 2020-08-02 ENCOUNTER — Ambulatory Visit: Payer: BC Managed Care – PPO

## 2020-08-03 ENCOUNTER — Telehealth: Payer: Self-pay

## 2020-08-03 NOTE — Telephone Encounter (Signed)
Copied from [place] [phone]. Topic: General - Call Back - No Documentation >> Aug 02, 2020  4:17 PM Erick Blinks wrote: Best contact: [phone]  Pt needs a call back from PCP, regarding medication management

## 2020-08-03 NOTE — Telephone Encounter (Signed)
Wegovy 1 box, 4 pen, 4 doses, 1 month supply. We can order rx in future when ready if plan to continue  Nobie Putnam, [facility] 08/03/2020, 12:43 PM

## 2020-08-03 NOTE — Telephone Encounter (Signed)
Patient will be here to pick up the samples before 12 since he was told to pick up when Dr Raliegh Ip is here as per patient.

## 2020-08-07 ENCOUNTER — Ambulatory Visit: Payer: BC Managed Care – PPO

## 2020-08-28 DIAGNOSIS — Z23 Encounter for immunization: Secondary | ICD-10-CM | POA: Diagnosis not present

## 2020-08-28 DIAGNOSIS — I4891 Unspecified atrial fibrillation: Secondary | ICD-10-CM | POA: Diagnosis not present

## 2020-08-29 ENCOUNTER — Ambulatory Visit
Admission: RE | Admit: 2020-08-29 | Discharge: 2020-08-29 | Disposition: A | Discharge status: Home / Self Care | Payer: BC Managed Care – PPO | Source: Ambulatory Visit | Attending: Family Medicine | Admitting: Family Medicine

## 2020-08-29 ENCOUNTER — Other Ambulatory Visit: Payer: Self-pay

## 2020-08-29 DIAGNOSIS — I714 Abdominal aortic aneurysm, without rupture, unspecified: Secondary | ICD-10-CM

## 2020-08-29 DIAGNOSIS — I77811 Abdominal aortic ectasia: Secondary | ICD-10-CM | POA: Diagnosis not present

## 2020-08-31 ENCOUNTER — Encounter: Payer: Self-pay | Admitting: Family Medicine

## 2020-08-31 ENCOUNTER — Ambulatory Visit (INDEPENDENT_AMBULATORY_CARE_PROVIDER_SITE_OTHER): Payer: BC Managed Care – PPO | Admitting: Family Medicine

## 2020-08-31 ENCOUNTER — Other Ambulatory Visit: Payer: Self-pay

## 2020-08-31 VITALS — BP 124/82 | HR 79 | Temp 97.5°F | Resp 16 | Ht 71.0 in | Wt 205.0 lb

## 2020-08-31 DIAGNOSIS — I714 Abdominal aortic aneurysm, without rupture, unspecified: Secondary | ICD-10-CM

## 2020-08-31 DIAGNOSIS — E669 Obesity, unspecified: Secondary | ICD-10-CM | POA: Diagnosis not present

## 2020-08-31 DIAGNOSIS — I4891 Unspecified atrial fibrillation: Secondary | ICD-10-CM | POA: Diagnosis not present

## 2020-08-31 DIAGNOSIS — R7303 Prediabetes: Secondary | ICD-10-CM

## 2020-08-31 MED ORDER — WEGOVY 0.5 MG/0.5ML ~~LOC~~ SOAJ: 0.5000 mg | SUBCUTANEOUS | 0 refills | Status: DC

## 2020-08-31 NOTE — Assessment & Plan Note (Signed)
Abnormal weight BMI >30 Comorbid conditions with PreDM A1c at risk of T2DM Hyperlipidemia Elevated BP AFIB  Order rx Wegovy for weight loss, titrate dose as advised

## 2020-08-31 NOTE — Progress Notes (Signed)
Subjective:    Patient ID: Alec Hurst, male    DOB: [birth year], 59 y.o.   MRN: [id number]  Alec Hurst is a 59 y.o. male presenting on 08/31/2020 for Weight Check, Pre-Diabetes, and Obesity   HPI   Pre-Diabetes / Obesity BMI >30 History of abnormal weight gain Prior A1c 6.9 (07/03/20) - at risk of type 2 diabetes CBGs:Not checking CBG Meds:trial sample dose of Wegovy 0.25mg  weekly x 4 weeks - has tolerated well - has restarted some Metformin - previously off med. Currentlynot on ACEi/ARB Lifestyle: Initial weight loss on 1st 4 weeks with 11 lb wt loss, 216 to 2015 - Diet (balanced diet, reducing portions) - Exercise (working on improving regular exercise)  Additional updates  Reviewed Abdominal Aorta US results below   Depression screen [facility] 2/9 08/31/2020 07/24/2020 01/17/2020  Decreased Interest 0 0 0  Down, Depressed, Hopeless 0 0 0  PHQ - 2 Score 0 0 0  Altered sleeping - - -  Tired, decreased energy - - -  Change in appetite - - -  Feeling bad or failure about yourself  - - -  Trouble concentrating - - -  Moving slowly or fidgety/restless - - -  Suicidal thoughts - - -  PHQ-9 Score - - -  Difficult doing work/chores - - -    Social History   Tobacco Use  . Smoking status: Never Smoker  . Smokeless tobacco: Never Used  Vaping Use  . Vaping Use: Never used  Substance Use Topics  . Alcohol use: Yes    Alcohol/week: 0.0 standard drinks    Comment: occasional  . Drug use: No    Review of Systems Per HPI unless specifically indicated above     Objective:    BP 124/82   Pulse 79   Temp (!) 97.5 F (36.4 C) (Temporal)   Resp 16   Ht 5\' 11"  (1.803 m)   Wt 205 lb (93 kg)   SpO2 99%   BMI 28.59 kg/m   Wt Readings from Last 3 Encounters:  08/31/20 205 lb (93 kg)  07/24/20 216 lb (98 kg)  12/13/19 216 lb 12.8 oz (98.3 kg)    Physical Exam Vitals and nursing note reviewed.  Constitutional:      General: He is not in acute distress.     Appearance: He is well-developed. He is obese. He is not diaphoretic.     Comments: Well-appearing, comfortable, cooperative  HENT:     Head: Normocephalic and atraumatic.  Eyes:     General:        Right eye: No discharge.        Left eye: No discharge.     Conjunctiva/sclera: Conjunctivae normal.  Neck:     Thyroid: No thyromegaly.  Cardiovascular:     Rate and Rhythm: Normal rate and regular rhythm.     Heart sounds: Normal heart sounds. No murmur heard.   Pulmonary:     Effort: Pulmonary effort is normal. No respiratory distress.     Breath sounds: Normal breath sounds. No wheezing or rales.  Musculoskeletal:        General: Normal range of motion.     Cervical back: Normal range of motion and neck supple.  Lymphadenopathy:     Cervical: No cervical adenopathy.  Skin:    General: Skin is warm and dry.     Findings: No erythema or rash.  Neurological:     Mental Status: He is alert and oriented to person,  place, and time.  Psychiatric:        Behavior: Behavior normal.     Comments: Well groomed, good eye contact, normal speech and thoughts        US AORTA DUPLEX LIMITED [[id number]] Resulted: 08/29/20 0905  Order Status: Completed Updated: 08/29/20 0908  Narrative:   CLINICAL DATA: Ectasia of the abdominal aorta seen on outside MRI.  Evaluate for abdominal aortic aneurysm.   EXAM:  ULTRASOUND OF ABDOMINAL AORTA   TECHNIQUE:  Ultrasound examination of the abdominal aorta and proximal common  iliac arteries was performed to evaluate for aneurysm. Additional  color and Doppler images of the distal aorta were obtained to  document patency.   COMPARISON: None.   FINDINGS:  Abdominal aortic measurements as follows:   Proximal: 3.1 x 2.9 cm   Mid: 2.2 x 2.2 cm   Distal: 2.8 x 2.7 cm  Patent: Yes, peak systolic velocity is 62 cm/s   There is a moderate amount of eccentric mixed echogenic plaque  involving the mid and distal aspects of the abdominal  aorta  (representative image 12).   Right common iliac artery: 1.4 x 1.3 cm   Left common iliac artery: 1.4 x 1.3 cm   There is diffuse increased slightly coarsened echogenicity of the  hepatic parenchyma suggestive of hepatic steatosis.   IMPRESSION:  1. Mild ectasia of the distal aspect of the abdominal aorta  measuring 2.8 cm in diameter. Recommend follow-up aortic ultrasound  in 5 years. This recommendation follows ACR consensus guidelines:  White Paper of the ACR Incidental Findings Committee II on Vascular  Findings. J Am Coll Radiol 2013; 10:789-794.  2. Aortic Atherosclerosis (ICD10-I70.0).  3. Increased slightly coarsened echogenicity of the hepatic  parenchyma suggestive of hepatic steatosis. Correlation with LFTs is  advised.    Electronically Signed  By: Sandi Mariscal M.D.  On: 08/29/2020 09:05       Results for orders placed or performed in visit on 06/30/20  RPR  Result Value Ref Range   RPR Ser Ql NON-REACTIVE NON-REACTI  HIV Antibody (routine testing w rflx)  Result Value Ref Range   HIV 1&2 Ab, 4th Generation NON-REACTIVE NON-REACTI  Uric acid  Result Value Ref Range   Uric Acid, Serum 8.1 (H) 4.0 - 8.0 mg/dL  CBC with Differential/Platelet  Result Value Ref Range   WBC 5.5 3.8 - 10.8 Thousand/uL   RBC 4.95 4.20 - 5.80 Million/uL   Hemoglobin 16.3 13.2 - 17.1 g/dL   HCT 49.4 38 - 50 %   MCV 99.8 80.0 - 100.0 fL   MCH 32.9 27.0 - 33.0 pg   MCHC 33.0 32.0 - 36.0 g/dL   RDW 12.4 11.0 - 15.0 %   Platelets 243 140 - 400 Thousand/uL   MPV 10.5 7.5 - 12.5 fL   Neutro Abs 3,003 1,500 - 7,800 cells/uL   Lymphs Abs 1,782 850 - 3,900 cells/uL   Absolute Monocytes 605 200 - 950 cells/uL   Eosinophils Absolute 72 15.0 - 500.0 cells/uL   Basophils Absolute 39 0.0 - 200.0 cells/uL   Neutrophils Relative % 54.6 %   Total Lymphocyte 32.4 %   Monocytes Relative 11.0 %   Eosinophils Relative 1.3 %   Basophils Relative 0.7 %  Hemoglobin A1c  Result Value  Ref Range   Hgb A1c MFr Bld 6.9 (H) <5.7 % of total Hgb   Mean Plasma Glucose 151 (calc)   eAG (mmol/L) 8.4 (calc)  Lipid panel  Result Value Ref Range  Cholesterol 203 (H) <200 mg/dL   HDL 48 > OR = 40 mg/dL   Triglycerides 125 <150 mg/dL   LDL Cholesterol (Calc) 131 (H) mg/dL (calc)   Total CHOL/HDL Ratio 4.2 <5.0 (calc)   Non-HDL Cholesterol (Calc) 155 (H) <130 mg/dL (calc)  PSA  Result Value Ref Range   PSA 0.50 < OR = 4.0 ng/mL  COMPLETE METABOLIC PANEL WITH GFR  Result Value Ref Range   Glucose, Bld 131 (H) 65 - 99 mg/dL   BUN 11 7 - 25 mg/dL   Creat 0.76 0.70 - 1.33 mg/dL   GFR, Est Non African American 101 > OR = 60 mL/min/1.13m2   GFR, Est African American 117 > OR = 60 mL/min/1.35m2   BUN/Creatinine Ratio NOT APPLICABLE 6 - 22 (calc)   Sodium 141 135 - 146 mmol/L   Potassium 4.2 3.5 - 5.3 mmol/L   Chloride 107 98 - 110 mmol/L   CO2 26 20 - 32 mmol/L   Calcium 9.0 8.6 - 10.3 mg/dL   Total Protein 6.3 6.1 - 8.1 g/dL   Albumin 4.1 3.6 - 5.1 g/dL   Globulin 2.2 1.9 - 3.7 g/dL (calc)   AG Ratio 1.9 1.0 - 2.5 (calc)   Total Bilirubin 0.7 0.2 - 1.2 mg/dL   Alkaline phosphatase (APISO) 57 35 - 144 U/L   AST 50 (H) 10 - 35 U/L   ALT 70 (H) 9 - 46 U/L      Assessment & Plan:   Problem List Items Addressed This Visit    Pre-diabetes    Significantly elevated A1c to 6.9 recently. At risk of T2DM Concern with HLD  Plan:  Continue samples of Wegovy 0.25mg  weekly for now will order new rx and complete PA if required for [facility] 0.5mg  weekly higher dose, may adjust every 4 weeks if indicated  Encourage improved lifestyle - low carb, low sugar diet, reduce portion size, continue improving regular exercise      Relevant Medications   WEGOVY 0.5 MG/0.5ML SOAJ   Obesity (BMI 30.0-34.9) - Primary    Abnormal weight BMI >30 Comorbid conditions with PreDM A1c at risk of T2DM Hyperlipidemia Elevated BP AFIB  Order rx [id number] for weight loss, titrate dose as advised        Relevant Medications   WEGOVY 0.5 MG/0.5ML SOAJ   Abdominal aortic aneurysm (AAA) without rupture ([place])    surveillance AAA [place] completed Prior imaging MRI 01/24/20 lumbar [facility] showed AAA 2.6 cm  Abd [place] 2.8 cm result now F/u within 5 years based on radiology report      Relevant Medications   flecainide (TAMBOCOR) 50 MG tablet   diltiazem (CARDIZEM CD) 120 MG 24 hr capsule      Meds ordered this encounter  Medications  . WEGOVY 0.5 MG/0.5ML SOAJ    Sig: Inject 0.5 mg into the skin once a week. For 4 weeks, then will need dose increase.    Dispense:  2 mL    Refill:  0   ____________________________________________________ Additional Rx Information (May be used for Prior Authorization if required)  Medication name and Strength: Wegovy 0.5 mg  1. Primary Diagnosis and ICD10 Code: Obesity BMI >30 (E66.9) 2. Secondary Diagnosis and ICD10 Code: Pre-Diabetes (R73.03) 3. Previous Failed Medications (Duration or Start Date) a. Metformin 500mg  (06/2018) 4. Quantity and Duration of New Medication: 63mL or 4 pen for 30 days 5. Additional Supporting Information: a. Lab result A1c 6.9 ____________________________________________________   Follow up plan: Return in  about 3 months (around 12/01/2020) for 3 month PreDM A1c, weight medicine.   Nobie Putnam, [facility] 08/31/2020, 8:51 AM

## 2020-08-31 NOTE — Patient Instructions (Addendum)
Thank you for coming to the office today.  Keep on current plan with [facility], we will submit a prior authorization for insurance approval  Can use sample in meantime.  Keep track of weight, contact us within 2-3 weeks before you run out. And we can re order the higher dose.  Dose increase as follows  Week 1 through week 4 : 0.25 mg once weekly. Week 5 through week 8: 0.5 mg once weekly. Week 9 through week 12: 1 mg once weekly. Week 13 through week 16: 1.7 mg once weekly. Week 17 and thereafter (maintenance dosage): 2.4 mg once weekly; if not tolerated, may temporarily decrease dosage to 1.7 mg once weekly for up to 4 additional weeks, then increase to 2.4 mg once weekly.   US AORTA DUPLEX LIMITED [[id number]] Resulted: 08/29/20 0905  Order Status: Completed Updated: 08/29/20 0908  Narrative:   CLINICAL DATA: Ectasia of the abdominal aorta seen on outside MRI.  Evaluate for abdominal aortic aneurysm.   EXAM:  ULTRASOUND OF ABDOMINAL AORTA   TECHNIQUE:  Ultrasound examination of the abdominal aorta and proximal common  iliac arteries was performed to evaluate for aneurysm. Additional  color and Doppler images of the distal aorta were obtained to  document patency.   COMPARISON: None.   FINDINGS:  Abdominal aortic measurements as follows:   Proximal: 3.1 x 2.9 cm   Mid: 2.2 x 2.2 cm   Distal: 2.8 x 2.7 cm  Patent: Yes, peak systolic velocity is 62 cm/s   There is a moderate amount of eccentric mixed echogenic plaque  involving the mid and distal aspects of the abdominal aorta  (representative image 12).   Right common iliac artery: 1.4 x 1.3 cm   Left common iliac artery: 1.4 x 1.3 cm   There is diffuse increased slightly coarsened echogenicity of the  hepatic parenchyma suggestive of hepatic steatosis.   IMPRESSION:  1. Mild ectasia of the distal aspect of the abdominal aorta  measuring 2.8 cm in diameter. Recommend follow-up aortic ultrasound  in 5 years.  This recommendation follows ACR consensus guidelines:  White Paper of the ACR Incidental Findings Committee II on Vascular  Findings. J Am Coll Radiol 2013; 10:789-794.  2. Aortic Atherosclerosis (ICD10-I70.0).  3. Increased slightly coarsened echogenicity of the hepatic  parenchyma suggestive of hepatic steatosis. Correlation with LFTs is  advised.    Electronically Signed  By: Sandi Mariscal M.D.  On: 08/29/2020 09:05      Please schedule a Follow-up Appointment to: Return in about 3 months (around 12/01/2020) for 3 month PreDM A1c, weight medicine.  If you have any other questions or concerns, please feel free to call the office or send a message through [place]. You may also schedule an earlier appointment if necessary.  Additionally, you may be receiving a survey about your experience at our office within a few days to 1 week by e-mail or mail. We value your feedback.  Nobie Putnam, DO [place]

## 2020-08-31 NOTE — Assessment & Plan Note (Signed)
Significantly elevated A1c to 6.9 recently. At risk of T2DM Concern with HLD  Plan:  Continue samples of Wegovy 0.25mg  weekly for now will order new rx and complete PA if required for [facility] 0.5mg  weekly higher dose, may adjust every 4 weeks if indicated  Encourage improved lifestyle - low carb, low sugar diet, reduce portion size, continue improving regular exercise

## 2020-08-31 NOTE — Assessment & Plan Note (Signed)
surveillance AAA [place] completed Prior imaging MRI 01/24/20 lumbar [place] showed AAA 2.6 cm  Abd [place] 2.8 cm result now F/u within 5 years based on radiology report

## 2020-09-01 DIAGNOSIS — R7303 Prediabetes: Secondary | ICD-10-CM

## 2020-09-01 DIAGNOSIS — R001 Bradycardia, unspecified: Secondary | ICD-10-CM | POA: Diagnosis not present

## 2020-09-01 DIAGNOSIS — I480 Paroxysmal atrial fibrillation: Secondary | ICD-10-CM | POA: Diagnosis not present

## 2020-09-01 DIAGNOSIS — I440 Atrioventricular block, first degree: Secondary | ICD-10-CM | POA: Diagnosis not present

## 2020-09-01 DIAGNOSIS — E669 Obesity, unspecified: Secondary | ICD-10-CM

## 2020-09-01 DIAGNOSIS — I4891 Unspecified atrial fibrillation: Secondary | ICD-10-CM | POA: Diagnosis not present

## 2020-09-19 DIAGNOSIS — I4891 Unspecified atrial fibrillation: Secondary | ICD-10-CM | POA: Diagnosis not present

## 2020-09-20 MED ORDER — WEGOVY 1 MG/0.5ML ~~LOC~~ SOAJ: 1.0000 mg | SUBCUTANEOUS | 1 refills | Status: DC

## 2020-09-20 NOTE — Addendum Note (Signed)
Addended by: Olin Hauser on: 09/20/2020 11:15 AM   Modules accepted: Orders

## 2020-10-09 DIAGNOSIS — I4891 Unspecified atrial fibrillation: Secondary | ICD-10-CM | POA: Diagnosis not present

## 2020-10-28 DIAGNOSIS — R7303 Prediabetes: Secondary | ICD-10-CM

## 2020-10-28 DIAGNOSIS — E669 Obesity, unspecified: Secondary | ICD-10-CM

## 2020-10-31 ENCOUNTER — Telehealth: Payer: Self-pay | Admitting: Family Medicine

## 2020-10-31 MED ORDER — WEGOVY 1.7 MG/0.75ML ~~LOC~~ SOAJ: 1.7000 mg | SUBCUTANEOUS | 0 refills | Status: DC

## 2020-10-31 NOTE — Telephone Encounter (Signed)
Called in regards to a denial for a PA for patients medication for WEGOVY 1 MG/0.5ML SOAJ.  Please call to discuss at [phone]

## 2020-10-31 NOTE — Addendum Note (Signed)
Addended by: Olin Hauser on: 10/31/2020 05:18 PM   Modules accepted: Orders

## 2020-11-23 DIAGNOSIS — G4733 Obstructive sleep apnea (adult) (pediatric): Secondary | ICD-10-CM | POA: Diagnosis not present

## 2020-11-23 DIAGNOSIS — I4891 Unspecified atrial fibrillation: Secondary | ICD-10-CM | POA: Diagnosis not present

## 2020-12-04 ENCOUNTER — Ambulatory Visit (INDEPENDENT_AMBULATORY_CARE_PROVIDER_SITE_OTHER): Payer: BC Managed Care – PPO | Admitting: Family Medicine

## 2020-12-04 ENCOUNTER — Encounter: Payer: Self-pay | Admitting: Family Medicine

## 2020-12-04 ENCOUNTER — Other Ambulatory Visit: Payer: Self-pay | Admitting: Family Medicine

## 2020-12-04 ENCOUNTER — Other Ambulatory Visit: Payer: Self-pay

## 2020-12-04 VITALS — BP 135/85 | HR 59 | Ht 71.0 in | Wt 193.4 lb

## 2020-12-04 DIAGNOSIS — R7303 Prediabetes: Secondary | ICD-10-CM

## 2020-12-04 DIAGNOSIS — E6609 Other obesity due to excess calories: Secondary | ICD-10-CM

## 2020-12-04 DIAGNOSIS — G8929 Other chronic pain: Secondary | ICD-10-CM

## 2020-12-04 LAB — POCT GLYCOSYLATED HEMOGLOBIN (HGB A1C): Hemoglobin A1C: 5.6 % (ref 4.0–5.6)

## 2020-12-04 MED ORDER — BACLOFEN 10 MG PO TABS
5.0000 mg | ORAL_TABLET | Freq: Three times a day (TID) | ORAL | 1 refills | Status: DC | PRN
Start: 2020-12-04 — End: 2021-12-31

## 2020-12-04 MED ORDER — WEGOVY 2.4 MG/0.75ML ~~LOC~~ SOAJ: 2.4000 mg | SUBCUTANEOUS | 0 refills | Status: DC

## 2020-12-04 NOTE — Assessment & Plan Note (Addendum)
Significantly improved A1c result today down to 5.6 Concern with HLD  Plan:  Continue new rx higher dose Wegovy 2.4mg  weekly injection for 4 weeks for weight loss, send to pharmacy. Has been covered monthly for each dose titration, now approx total 6th month after starting at 0.25mg  dosage given significant improvement, may be covered again in future in 1 year. If needed Encourage improved lifestyle - low carb, low sugar diet, reduce portion size, continue improving regular exercise

## 2020-12-04 NOTE — Assessment & Plan Note (Signed)
Abnormal weight gain Obesity Comorbid conditions with PreDM A1c at risk of T2DM Hyperlipidemia Elevated BP AFIB  Continue new rx higher dose Wegovy 2.4mg  weekly injection for 4 weeks for weight loss, send to pharmacy. Has been covered monthly for each dose titration, now approx total 6th month after starting at 0.25mg  dosage given significant improvement, may be covered again in future in 1 year. If needed Encourage improved lifestyle - low carb, low sugar diet, reduce portion size, continue improving regular exercise

## 2020-12-04 NOTE — Progress Notes (Signed)
Subjective:    Patient ID: Alec Hurst, male    DOB: 31-Aug-1961, 60 y.o.   MRN: [id number]  Alec Hurst is a 60 y.o. male presenting on 12/04/2020 for 3 month follow up (Pre-diabetes and check a1c/Weight medicine)   HPI  Pre-Diabetes/ Obesity History of abnormal weight gain Prior A1c 6.9 (07/03/20) - at risk of type 2 diabetes Now due for A1c lab today CBGs:Not checking CBG Meds: Wegovy 1.7mg  weekly injection (has titrated up from 0.25 to 0.5 to 1mg ) Tolerated well occasional some nausea Restarted Metformin at 500mg  daily Currentlynot on ACEi/ARB Lifestyle: Weight loss down by 23 lbs since 07/2020 - Diet (balanced diet, reducing portions) - Exercise (working on improving regular exercise)  Upcoming ablation in few weeks Needs refill Baclofen  Upcoming Colonoscopy [facility]   Depression screen [facility] 2/9 08/31/2020 07/24/2020 01/17/2020  Decreased Interest 0 0 0  Down, Depressed, Hopeless 0 0 0  PHQ - 2 Score 0 0 0  Altered sleeping - - -  Tired, decreased energy - - -  Change in appetite - - -  Feeling bad or failure about yourself  - - -  Trouble concentrating - - -  Moving slowly or fidgety/restless - - -  Suicidal thoughts - - -  PHQ-9 Score - - -  Difficult doing work/chores - - -    Social History   Tobacco Use  . Smoking status: Never Smoker  . Smokeless tobacco: Never Used  Vaping Use  . Vaping Use: Never used  Substance Use Topics  . Alcohol use: Yes    Alcohol/week: 0.0 standard drinks    Comment: occasional  . Drug use: No    Review of Systems Per HPI unless specifically indicated above     Objective:    BP 135/85   Pulse (!) 59   Ht 5\' 11"  (1.803 m)   Wt 193 lb 6.4 oz (87.7 kg)   SpO2 100%   BMI 26.97 kg/m   Wt Readings from Last 3 Encounters:  12/04/20 193 lb 6.4 oz (87.7 kg)  08/31/20 205 lb (93 kg)  07/24/20 216 lb (98 kg)    Physical Exam Vitals and nursing note reviewed.  Constitutional:      General: He is not in acute  distress.    Appearance: He is well-developed and well-nourished. He is not diaphoretic.     Comments: Well-appearing, comfortable, cooperative  HENT:     Head: Normocephalic and atraumatic.     Mouth/Throat:     Mouth: Oropharynx is clear and moist.  Eyes:     General:        Right eye: No discharge.        Left eye: No discharge.     Conjunctiva/sclera: Conjunctivae normal.  Cardiovascular:     Rate and Rhythm: Normal rate.  Pulmonary:     Effort: Pulmonary effort is normal.  Musculoskeletal:        General: No edema.  Skin:    General: Skin is warm and dry.     Findings: No erythema or rash.  Neurological:     Mental Status: He is alert and oriented to person, place, and time.  Psychiatric:        Mood and Affect: Mood and affect normal.        Behavior: Behavior normal.     Comments: Well groomed, good eye contact, normal speech and thoughts    Recent Labs    07/03/20 0812 12/04/20 0915  HGBA1C 6.9*  5.6    Results for orders placed or performed in visit on 12/04/20  POCT HgB A1C  Result Value Ref Range   Hemoglobin A1C 5.6 4.0 - 5.6 %      Assessment & Plan:   Problem List Items Addressed This Visit    Pre-diabetes - Primary    Significantly improved A1c result today down to 5.6 Concern with HLD  Plan:  Continue new rx higher dose Wegovy 2.4mg  weekly injection for 4 weeks for weight loss, send to pharmacy. Has been covered monthly for each dose titration, now approx total 6th month after starting at 0.25mg  dosage given significant improvement, may be covered again in future in 1 year. If needed Encourage improved lifestyle - low carb, low sugar diet, reduce portion size, continue improving regular exercise      Relevant Medications   WEGOVY 2.4 MG/0.75ML SOAJ   Other Relevant Orders   POCT HgB A1C (Completed)   Obesity    Abnormal weight gain Obesity Comorbid conditions with PreDM A1c at risk of T2DM Hyperlipidemia Elevated BP AFIB  Continue new  rx higher dose Wegovy 2.4mg  weekly injection for 4 weeks for weight loss, send to pharmacy. Has been covered monthly for each dose titration, now approx total 6th month after starting at 0.25mg  dosage given significant improvement, may be covered again in future in 1 year. If needed Encourage improved lifestyle - low carb, low sugar diet, reduce portion size, continue improving regular exercise      Relevant Medications   WEGOVY 2.4 MG/0.75ML SOAJ        Meds ordered this encounter  Medications  . WEGOVY 2.4 MG/0.75ML SOAJ    Sig: Inject 2.4 mg into the skin once a week.    Dispense:  3 mL    Refill:  0      Follow up plan: Return in about 5 months (around 05/03/2021) for 5 month follow-up PreDM A1c, Weight.   Nobie Putnam, [facility] 12/04/2020, 9:14 AM

## 2020-12-04 NOTE — Patient Instructions (Addendum)
Thank you for coming to the office today.  Take Wegovy 2.4mg  weekly injection for 4 weeks, can space it out if needed.  Refilled Baclofen.  Will stay tuned for Colonoscopy and Ablation procedures  Keep on Metformin if helping.   Please schedule a Follow-up Appointment to: Return in about 5 months (around 05/03/2021) for 5 month follow-up PreDM A1c, Weight.  If you have any other questions or concerns, please feel free to call the office or send a message through [place]. You may also schedule an earlier appointment if necessary.  Additionally, you may be receiving a survey about your experience at our office within a few days to 1 week by e-mail or mail. We value your feedback.  Nobie Putnam, DO [place]

## 2020-12-25 ENCOUNTER — Ambulatory Visit: Payer: BC Managed Care – PPO | Admitting: Family Medicine

## 2021-01-25 DIAGNOSIS — G4733 Obstructive sleep apnea (adult) (pediatric): Secondary | ICD-10-CM | POA: Diagnosis not present

## 2021-01-25 DIAGNOSIS — N401 Enlarged prostate with lower urinary tract symptoms: Secondary | ICD-10-CM | POA: Diagnosis not present

## 2021-02-01 DIAGNOSIS — N200 Calculus of kidney: Secondary | ICD-10-CM | POA: Diagnosis not present

## 2021-02-01 DIAGNOSIS — Z125 Encounter for screening for malignant neoplasm of prostate: Secondary | ICD-10-CM | POA: Diagnosis not present

## 2021-02-01 DIAGNOSIS — Z87442 Personal history of urinary calculi: Secondary | ICD-10-CM | POA: Diagnosis not present

## 2021-02-01 DIAGNOSIS — N5203 Combined arterial insufficiency and corporo-venous occlusive erectile dysfunction: Secondary | ICD-10-CM | POA: Diagnosis not present

## 2021-02-19 DIAGNOSIS — Z1152 Encounter for screening for COVID-19: Secondary | ICD-10-CM | POA: Diagnosis not present

## 2021-02-19 NOTE — Telephone Encounter (Signed)
Pt has called and thinks his MyChart server is down with [facility] as his [employer] Is working. I have read him the elements needed in the letter and he states that he was positive on 2/24 thru [facility] site. Pt went back to work without symptoms on 12/22/20 and has had no symptoms since. He was tested again thru [facility] today as Derek [place] is requiring letter regardless of situation boosted or not. Please put letter on MyChart

## 2021-03-05 DIAGNOSIS — L739 Follicular disorder, unspecified: Secondary | ICD-10-CM | POA: Diagnosis not present

## 2021-03-05 DIAGNOSIS — L550 Sunburn of first degree: Secondary | ICD-10-CM | POA: Diagnosis not present

## 2021-03-05 DIAGNOSIS — L578 Other skin changes due to chronic exposure to nonionizing radiation: Secondary | ICD-10-CM | POA: Diagnosis not present

## 2021-03-05 DIAGNOSIS — L570 Actinic keratosis: Secondary | ICD-10-CM | POA: Diagnosis not present

## 2021-03-05 DIAGNOSIS — L304 Erythema intertrigo: Secondary | ICD-10-CM | POA: Diagnosis not present

## 2021-03-05 DIAGNOSIS — L820 Inflamed seborrheic keratosis: Secondary | ICD-10-CM | POA: Diagnosis not present

## 2021-03-06 DIAGNOSIS — I4891 Unspecified atrial fibrillation: Secondary | ICD-10-CM | POA: Diagnosis not present

## 2021-03-26 DIAGNOSIS — Z7901 Long term (current) use of anticoagulants: Secondary | ICD-10-CM | POA: Diagnosis not present

## 2021-03-26 DIAGNOSIS — F410 Panic disorder [episodic paroxysmal anxiety] without agoraphobia: Secondary | ICD-10-CM | POA: Diagnosis not present

## 2021-03-26 DIAGNOSIS — Z8679 Personal history of other diseases of the circulatory system: Secondary | ICD-10-CM | POA: Diagnosis not present

## 2021-03-26 DIAGNOSIS — G4733 Obstructive sleep apnea (adult) (pediatric): Secondary | ICD-10-CM | POA: Diagnosis not present

## 2021-03-26 DIAGNOSIS — Z79899 Other long term (current) drug therapy: Secondary | ICD-10-CM | POA: Diagnosis not present

## 2021-03-26 DIAGNOSIS — I480 Paroxysmal atrial fibrillation: Secondary | ICD-10-CM | POA: Diagnosis not present

## 2021-03-26 DIAGNOSIS — M5442 Lumbago with sciatica, left side: Secondary | ICD-10-CM | POA: Diagnosis not present

## 2021-03-26 DIAGNOSIS — Z86010 Personal history of colonic polyps: Secondary | ICD-10-CM | POA: Diagnosis not present

## 2021-03-26 DIAGNOSIS — K429 Umbilical hernia without obstruction or gangrene: Secondary | ICD-10-CM | POA: Diagnosis not present

## 2021-03-26 DIAGNOSIS — Z20822 Contact with and (suspected) exposure to covid-19: Secondary | ICD-10-CM | POA: Diagnosis not present

## 2021-03-26 DIAGNOSIS — F411 Generalized anxiety disorder: Secondary | ICD-10-CM | POA: Diagnosis not present

## 2021-03-26 DIAGNOSIS — R7303 Prediabetes: Secondary | ICD-10-CM | POA: Diagnosis not present

## 2021-03-26 DIAGNOSIS — I10 Essential (primary) hypertension: Secondary | ICD-10-CM | POA: Diagnosis not present

## 2021-03-26 DIAGNOSIS — E785 Hyperlipidemia, unspecified: Secondary | ICD-10-CM | POA: Diagnosis not present

## 2021-03-26 DIAGNOSIS — G8929 Other chronic pain: Secondary | ICD-10-CM | POA: Diagnosis not present

## 2021-03-26 DIAGNOSIS — K219 Gastro-esophageal reflux disease without esophagitis: Secondary | ICD-10-CM | POA: Diagnosis not present

## 2021-03-28 DIAGNOSIS — I4819 Other persistent atrial fibrillation: Secondary | ICD-10-CM | POA: Diagnosis not present

## 2021-03-28 DIAGNOSIS — E785 Hyperlipidemia, unspecified: Secondary | ICD-10-CM | POA: Diagnosis not present

## 2021-03-28 DIAGNOSIS — Z79899 Other long term (current) drug therapy: Secondary | ICD-10-CM | POA: Diagnosis not present

## 2021-03-28 DIAGNOSIS — I10 Essential (primary) hypertension: Secondary | ICD-10-CM | POA: Diagnosis not present

## 2021-03-28 DIAGNOSIS — I4891 Unspecified atrial fibrillation: Secondary | ICD-10-CM | POA: Diagnosis not present

## 2021-03-28 DIAGNOSIS — Z7901 Long term (current) use of anticoagulants: Secondary | ICD-10-CM | POA: Diagnosis not present

## 2021-03-28 DIAGNOSIS — F419 Anxiety disorder, unspecified: Secondary | ICD-10-CM | POA: Diagnosis not present

## 2021-03-28 DIAGNOSIS — R7303 Prediabetes: Secondary | ICD-10-CM | POA: Diagnosis not present

## 2021-04-01 DIAGNOSIS — E6609 Other obesity due to excess calories: Secondary | ICD-10-CM

## 2021-04-01 DIAGNOSIS — R7303 Prediabetes: Secondary | ICD-10-CM

## 2021-04-04 MED ORDER — OZEMPIC (0.25 OR 0.5 MG/DOSE) 2 MG/1.5ML ~~LOC~~ SOPN: 0.2500 mg | PEN_INJECTOR | SUBCUTANEOUS | 0 refills | Status: DC

## 2021-04-04 NOTE — Addendum Note (Signed)
Addended by: Olin Hauser on: 04/04/2021 06:01 PM   Modules accepted: Orders

## 2021-04-26 DIAGNOSIS — R7303 Prediabetes: Secondary | ICD-10-CM | POA: Diagnosis not present

## 2021-04-26 DIAGNOSIS — Z86010 Personal history of colonic polyps: Secondary | ICD-10-CM | POA: Diagnosis not present

## 2021-04-26 DIAGNOSIS — N528 Other male erectile dysfunction: Secondary | ICD-10-CM | POA: Diagnosis not present

## 2021-04-26 DIAGNOSIS — Z01818 Encounter for other preprocedural examination: Secondary | ICD-10-CM | POA: Diagnosis not present

## 2021-04-26 DIAGNOSIS — I10 Essential (primary) hypertension: Secondary | ICD-10-CM | POA: Diagnosis not present

## 2021-04-26 DIAGNOSIS — E7849 Other hyperlipidemia: Secondary | ICD-10-CM | POA: Diagnosis not present

## 2021-04-26 DIAGNOSIS — G4733 Obstructive sleep apnea (adult) (pediatric): Secondary | ICD-10-CM | POA: Diagnosis not present

## 2021-04-26 DIAGNOSIS — K219 Gastro-esophageal reflux disease without esophagitis: Secondary | ICD-10-CM | POA: Diagnosis not present

## 2021-04-26 DIAGNOSIS — I480 Paroxysmal atrial fibrillation: Secondary | ICD-10-CM | POA: Diagnosis not present

## 2021-04-26 DIAGNOSIS — I714 Abdominal aortic aneurysm, without rupture: Secondary | ICD-10-CM | POA: Diagnosis not present

## 2021-04-26 DIAGNOSIS — M1000 Idiopathic gout, unspecified site: Secondary | ICD-10-CM | POA: Diagnosis not present

## 2021-04-30 ENCOUNTER — Other Ambulatory Visit: Payer: Self-pay | Admitting: Family Medicine

## 2021-04-30 ENCOUNTER — Other Ambulatory Visit (HOSPITAL_COMMUNITY)
Admission: RE | Admit: 2021-04-30 | Discharge: 2021-04-30 | Disposition: A | Discharge status: Home / Self Care | Payer: BC Managed Care – PPO | Source: Ambulatory Visit | Attending: Family Medicine | Admitting: Family Medicine

## 2021-04-30 ENCOUNTER — Encounter: Payer: Self-pay | Admitting: Family Medicine

## 2021-04-30 ENCOUNTER — Ambulatory Visit: Payer: BC Managed Care – PPO | Admitting: Family Medicine

## 2021-04-30 ENCOUNTER — Other Ambulatory Visit: Payer: Self-pay

## 2021-04-30 VITALS — BP 124/85 | HR 57 | Ht 71.0 in | Wt 189.0 lb

## 2021-04-30 DIAGNOSIS — Z113 Encounter for screening for infections with a predominantly sexual mode of transmission: Secondary | ICD-10-CM | POA: Diagnosis not present

## 2021-04-30 DIAGNOSIS — E782 Mixed hyperlipidemia: Secondary | ICD-10-CM

## 2021-04-30 DIAGNOSIS — R7303 Prediabetes: Secondary | ICD-10-CM

## 2021-04-30 DIAGNOSIS — Z7252 High risk homosexual behavior: Secondary | ICD-10-CM

## 2021-04-30 DIAGNOSIS — Z0000 Encounter for general adult medical examination without abnormal findings: Secondary | ICD-10-CM

## 2021-04-30 DIAGNOSIS — B853 Phthiriasis: Secondary | ICD-10-CM

## 2021-04-30 DIAGNOSIS — Z125 Encounter for screening for malignant neoplasm of prostate: Secondary | ICD-10-CM

## 2021-04-30 DIAGNOSIS — M1009 Idiopathic gout, multiple sites: Secondary | ICD-10-CM

## 2021-04-30 LAB — POCT GLYCOSYLATED HEMOGLOBIN (HGB A1C): Hemoglobin A1C: 5.6 % (ref 4.0–5.6)

## 2021-04-30 MED ORDER — PERMETHRIN 5 % EX CREA
TOPICAL_CREAM | CUTANEOUS | 1 refills | Status: AC
Start: 2021-04-30 — End: ?

## 2021-04-30 NOTE — Patient Instructions (Addendum)
Thank you for coming to the office today.  Alec Hurst was started 08/31/21 we can possibly re order after that date if interested.  FInish current Ozempic sample, if need another we can do that, if you want me to try ordering Ozempic as a Pre Diabetic we can try that. Otherwise if prefer to wait on the [facility].  STD screening today.  DUE for FASTING BLOOD WORK (no food or drink after midnight before the lab appointment, only water or coffee without cream/sugar on the morning of)  SCHEDULE "Lab Only" visit in the morning at the clinic for lab draw in 3 MONTHS   - Make sure Lab Only appointment is at about 1 week before your next appointment, so that results will be available  For Lab Results, once available within 2-3 days of blood draw, you can can log in to MyChart online to view your results and a brief explanation. Also, we can discuss results at next follow-up visit.  Please schedule a Follow-up Appointment to: Return in about 3 months (around 07/31/2021) for 3-4 month fasting lab only then 1 week later Annual Physical.  If you have any other questions or concerns, please feel free to call the office or send a message through [place]. You may also schedule an earlier appointment if necessary.  Additionally, you may be receiving a survey about your experience at our office within a few days to 1 week by e-mail or mail. We value your feedback.  Nobie Putnam, DO [place]

## 2021-04-30 NOTE — Assessment & Plan Note (Signed)
Stable A1c well controlled at 5.6, after was off GLP1 therapy for several months and back on Ozempic low dose sample. Concern with HLD  Plan:  Continue ozempic 4 more weeks remaining on sample 0.25 up to 0.5mg  dosing weekly. May remain OFF GLP1 until re-eval in 08/2021 and may be indicated for repeat Wegovy course if qualify BMI >30  Encourage improved lifestyle - low carb, low sugar diet, reduce portion size, continue improving regular exercise

## 2021-04-30 NOTE — Progress Notes (Signed)
Subjective:    Patient ID: Alec Hurst, male    DOB: 1960/11/03, 60 y.o.   MRN: [id number]  Alec Hurst is a 60 y.o. male presenting on 04/30/2021 for Prediabetes   HPI  Pre-Diabetes BMI >26 Weight down significantly on course with GLP1 therapy He maxed out on Wegovy dose in Feb 2022, at 2.4mg  dose weekly, and then came off of this, no longer covered. His appetite gradually returned. He did well during the gap 4 months between. He felt more sugar cravings in PM. - Most recent update in June 04/04/21 given Ozempic sample, started on low dose 0.25mg  weekly, with some improvement again curbing appetite. Not quite as effective as the higher dose - Today A1c 5.6, remained stable. - He is doing well and hopes to remain on some dose of GLP1 going forward. 1 year repeat course of Wegovy may be eligible starting 08/31/21 if indicated BMI >30  STD Screening Requested today for blood and urine testing.  Health Maintenance:  Upcoming Colonoscopy next week. Last done 2016.  Depression screen [facility] 2/9 04/30/2021 08/31/2020 07/24/2020  Decreased Interest 0 0 0  Down, Depressed, Hopeless 0 0 0  PHQ - 2 Score 0 0 0  Altered sleeping 0 - -  Tired, decreased energy 2 - -  Change in appetite 0 - -  Feeling bad or failure about yourself  0 - -  Trouble concentrating 0 - -  Moving slowly or fidgety/restless 0 - -  Suicidal thoughts 0 - -  PHQ-9 Score 2 - -  Difficult doing work/chores - - -    Social History   Tobacco Use   Smoking status: Never   Smokeless tobacco: Never  Vaping Use   Vaping Use: Never used  Substance Use Topics   Alcohol use: Yes    Alcohol/week: 0.0 standard drinks    Comment: occasional   Drug use: No    Review of Systems Per HPI unless specifically indicated above     Objective:    BP 124/85   Pulse (!) 57   Ht 5\' 11"  (1.803 m)   Wt 189 lb (85.7 kg)   SpO2 100%   BMI 26.36 kg/m   Wt Readings from Last 3 Encounters:  04/30/21 189 lb (85.7 kg)   12/04/20 193 lb 6.4 oz (87.7 kg)  08/31/20 205 lb (93 kg)    Physical Exam Vitals and nursing note reviewed.  Constitutional:      General: He is not in acute distress.    Appearance: Normal appearance. He is well-developed. He is not diaphoretic.     Comments: Well-appearing, comfortable, cooperative  HENT:     Head: Normocephalic and atraumatic.  Eyes:     General:        Right eye: No discharge.        Left eye: No discharge.     Conjunctiva/sclera: Conjunctivae normal.  Cardiovascular:     Rate and Rhythm: Normal rate.  Pulmonary:     Effort: Pulmonary effort is normal.  Skin:    General: Skin is warm and dry.     Findings: No erythema or rash.  Neurological:     Mental Status: He is alert and oriented to person, place, and time.  Psychiatric:        Mood and Affect: Mood normal.        Behavior: Behavior normal.        Thought Content: Thought content normal.     Comments: Well groomed, good  eye contact, normal speech and thoughts   Results for orders placed or performed in visit on 04/30/21  POCT HgB A1C  Result Value Ref Range   Hemoglobin A1C 5.6 4.0 - 5.6 %      Assessment & Plan:   Problem List Items Addressed This Visit     Pre-diabetes - Primary    Stable A1c well controlled at 5.6, after was off GLP1 therapy for several months and back on Ozempic low dose sample. Concern with HLD  Plan:  Continue ozempic 4 more weeks remaining on sample 0.25 up to 0.5mg  dosing weekly. May remain OFF GLP1 until re-eval in 08/2021 and may be indicated for repeat Wegovy course if qualify BMI >30  Encourage improved lifestyle - low carb, low sugar diet, reduce portion size, continue improving regular exercise       Relevant Orders   POCT HgB A1C (Completed)   High risk sexual behavior   Relevant Orders   HIV Antibody (routine testing w rflx)   RPR   GC/Chlamydia probe amp ([place])not at [facility]   Other Visit Diagnoses     Screening examination for STD  (sexually transmitted disease)       Relevant Orders   HIV Antibody (routine testing w rflx)   RPR   GC/Chlamydia probe amp ([place])not at [facility]       Will check routine screening today Blood test for HIV / RPR Urine test for GC Chlamydia Follow up results.   No orders of the defined types were placed in this encounter.     Follow up plan: Return in about 3 months (around 07/31/2021) for 3-4 month fasting lab only then 1 week later Annual Physical.  Future labs ordered for 07/31/21   Nobie Putnam, [facility] 04/30/2021, 9:29 AM

## 2021-05-01 LAB — HIV ANTIBODY (ROUTINE TESTING W REFLEX): HIV 1&2 Ab, 4th Generation: NONREACTIVE

## 2021-05-01 LAB — RPR: RPR Ser Ql: NONREACTIVE

## 2021-05-02 LAB — GC/CHLAMYDIA PROBE AMP (~~LOC~~) NOT AT ARMC
Chlamydia: NEGATIVE
Comment: NEGATIVE
Comment: NORMAL
Neisseria Gonorrhea: NEGATIVE

## 2021-05-07 DIAGNOSIS — G4733 Obstructive sleep apnea (adult) (pediatric): Secondary | ICD-10-CM | POA: Diagnosis not present

## 2021-05-25 ENCOUNTER — Encounter: Payer: Self-pay | Admitting: Family Medicine

## 2021-05-25 DIAGNOSIS — R351 Nocturia: Secondary | ICD-10-CM

## 2021-05-28 DIAGNOSIS — R351 Nocturia: Secondary | ICD-10-CM | POA: Insufficient documentation

## 2021-07-30 DIAGNOSIS — R109 Unspecified abdominal pain: Secondary | ICD-10-CM | POA: Diagnosis not present

## 2021-07-30 DIAGNOSIS — N201 Calculus of ureter: Secondary | ICD-10-CM | POA: Diagnosis not present

## 2021-07-30 DIAGNOSIS — N200 Calculus of kidney: Secondary | ICD-10-CM | POA: Diagnosis not present

## 2021-07-31 ENCOUNTER — Other Ambulatory Visit: Payer: Self-pay

## 2021-07-31 ENCOUNTER — Encounter: Payer: Self-pay | Admitting: Family Medicine

## 2021-07-31 ENCOUNTER — Ambulatory Visit (INDEPENDENT_AMBULATORY_CARE_PROVIDER_SITE_OTHER): Payer: BC Managed Care – PPO | Admitting: Family Medicine

## 2021-07-31 ENCOUNTER — Encounter: Payer: BC Managed Care – PPO | Admitting: Family Medicine

## 2021-07-31 VITALS — BP 145/64 | HR 101 | Ht 71.0 in | Wt 193.2 lb

## 2021-07-31 DIAGNOSIS — I480 Paroxysmal atrial fibrillation: Secondary | ICD-10-CM | POA: Diagnosis not present

## 2021-07-31 DIAGNOSIS — R7303 Prediabetes: Secondary | ICD-10-CM | POA: Diagnosis not present

## 2021-07-31 DIAGNOSIS — Z0000 Encounter for general adult medical examination without abnormal findings: Secondary | ICD-10-CM

## 2021-07-31 DIAGNOSIS — N210 Calculus in bladder: Secondary | ICD-10-CM | POA: Diagnosis not present

## 2021-07-31 DIAGNOSIS — M1009 Idiopathic gout, multiple sites: Secondary | ICD-10-CM

## 2021-07-31 DIAGNOSIS — E782 Mixed hyperlipidemia: Secondary | ICD-10-CM

## 2021-07-31 DIAGNOSIS — N200 Calculus of kidney: Secondary | ICD-10-CM | POA: Diagnosis not present

## 2021-07-31 DIAGNOSIS — I700 Atherosclerosis of aorta: Secondary | ICD-10-CM

## 2021-07-31 NOTE — Assessment & Plan Note (Signed)
Stable A1c well controlled at 5.6 previously off GLP1 Due for repeat labs now Concern with HLD  Plan:  Remain off GLP1 reconsider Wegovy GLP1 in 2023  Encourage improved lifestyle - low carb, low sugar diet, reduce portion size, continue improving regular exercise

## 2021-07-31 NOTE — Progress Notes (Signed)
Subjective:    Patient ID: Alec Hurst, male    DOB: 1961/02/27, 60 y.o.   MRN: [id number]  Alec Hurst is a 60 y.o. male presenting on 07/31/2021 for Annual Exam   HPI  Here for Annual Physical and Lab Review.   Pre-Diabetes / Overweight BMI >26 off wegovy and ozempic for months now, has some inc hunger but overall doing well, some wt gain. 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  - He takes Propranolol 10mg  daily PRN for performance anxiety, otherwise not taking regularly or other meds - Currently doing well   Abdominal Aorta Diltation/Aneurysm Abd [place] Screening Aorta done 08/29/20, next repeat within 5 years Aortic atherosclerosis on image, he remains on medication     Health Maintenance:     Colon CA Screening: 08/17/15 - [facility] Dr Vira Agar - polypx2 tubular adenoma, int hem, rpt 5 yr  [facility] Dr Bayard Hugger for upcoming scheduling colonoscopy likely in 2023   Health Maintenance:  Due for Flu Shot, declines today despite counseling on benefits  Updated on COVID vaccines but will need new booster when ready.   Depression screen [facility] 2/9 04/30/2021 08/31/2020 07/24/2020  Decreased Interest 0 0 0  Down, Depressed, Hopeless 0 0 0  PHQ - 2 Score 0 0 0  Altered sleeping 0 - -  Tired, decreased energy 2 - -  Change in appetite 0 - -  Feeling bad or failure about yourself  0 - -  Trouble  concentrating 0 - -  Moving slowly or fidgety/restless 0 - -  Suicidal thoughts 0 - -  PHQ-9 Score 2 - -  Difficult doing work/chores - - -    Past Medical History:  Diagnosis Date   Calculus of kidney    Delayed ejaculation    ED (erectile dysfunction)    Hx of colonic polyps    Impotence, organic    Malaise and fatigue    Panic disorder with agoraphobia    Sciatica    Past Surgical History:  Procedure Laterality Date   LITHOTRIPSY     Social History   Socioeconomic History   Marital status: Married    Spouse name: Not on file   Number of children: Not on file   Years of education: Not on file   Highest education level: Not on file  Occupational History   Occupation: Musician Copywriter, advertising, Photographer)  Tobacco Use   Smoking status: Never   Smokeless tobacco: Never  Vaping Use   Vaping Use: Never used  Substance and Sexual Activity   Alcohol use: Yes    Alcohol/week: 0.0 standard drinks    Comment: occasional   Drug use: No   Sexual activity: Not on file  Other Topics Concern   Not on file  Social History Narrative   Not on file   Social Determinants of Health   Financial Resource Strain: Not on file  Food Insecurity: Not on  file  Transportation Needs: Not on file  Physical Activity: Not on file  Stress: Not on file  Social Connections: Not on file  Intimate Partner Violence: Not on file   Family History  Problem Relation Age of Onset   Colon cancer Maternal Aunt    Other Mother        rx med addiction before passing   Heart attack Paternal Uncle 20   Current Outpatient Medications on File Prior to Visit  Medication Sig   baclofen (LIORESAL) 10 MG tablet Take 0.5-1 tablets (5-10 mg total) by mouth 3 (three) times daily as needed for muscle spasms.   colchicine 0.6 MG tablet Start with 2 pills if gout flare, may repeat 1 pill in 2 hours, then can take 1 pill daily for up to 7-10 days for gout flare   diazepam (VALIUM) 5 MG tablet Take 5 mg by mouth  as directed.   diltiazem (CARDIZEM CD) 120 MG 24 hr capsule Take 120 mg by mouth daily.   ELIQUIS 5 MG TABS tablet Take 5 mg by mouth 2 (two) times daily.   flecainide (TAMBOCOR) 50 MG tablet Take 50 mg by mouth 2 (two) times daily.   ketoconazole (NIZORAL) 2 % shampoo Apply 1 application topically daily as needed for irritation. Up to 1 week as needed for flare   LOTEMAX SM 0.38 % GEL INSTILL 1 DROP INTO BOTH EYES THREE TIMES DAILY FOR 1WEEK   Melatonin 5 MG TABS Take 5 mg by mouth at bedtime as needed.   metFORMIN (GLUCOPHAGE) 500 MG tablet Take 1 tablet (500 mg total) by mouth 2 (two) times daily with a meal.   naproxen (NAPROSYN) 500 MG tablet Take 1 tablet (500 mg total) by mouth 2 (two) times daily with a meal. For 2-4 weeks then as needed   omeprazole (PRILOSEC) 20 MG capsule TAKE 1 CAPSULE(20 MG) BY MOUTH DAILY BEFORE BREAKFAST   permethrin (ELIMITE) 5 % cream APPLY TO WHOLE BODY, FROM HEAD TO TOE AT BEDTIME, LEAVE ON FOR 8 TO 12 HOURS( OVERNIGHT), WASH OFF NEXT DAY. REPEAT IN 14 DAYS AS NEEDED   propranolol (INDERAL) 10 MG tablet Take 1 tablet (10 mg total) by mouth daily as needed (performance anxiety, panic).   sildenafil (REVATIO) 20 MG tablet Take 20 mg by mouth 3 (three) times daily.   sucralfate (CARAFATE) 1 g tablet TAKE 1 TABLET(1 GRAM) BY MOUTH FOUR TIMES DAILY AT BEDTIME AND WITH MEALS AS NEEDED FOR ACID REFLUX   No current facility-administered medications on file prior to visit.    Review of Systems  Constitutional:  Negative for activity change, appetite change, chills, diaphoresis, fatigue and fever.  HENT:  Negative for congestion and hearing loss.   Eyes:  Negative for visual disturbance.  Respiratory:  Negative for cough, chest tightness, shortness of breath and wheezing.   Cardiovascular:  Negative for chest pain, palpitations and leg swelling.  Gastrointestinal:  Negative for abdominal pain, constipation, diarrhea, nausea and vomiting.  Genitourinary:  Negative  for dysuria, frequency and hematuria.  Musculoskeletal:  Negative for arthralgias and neck pain.  Skin:  Negative for rash.  Neurological:  Negative for dizziness, weakness, light-headedness, numbness and headaches.  Hematological:  Negative for adenopathy.  Psychiatric/Behavioral:  Negative for behavioral problems, dysphoric mood and sleep disturbance.   Per HPI unless specifically indicated above      Objective:    BP (!) 145/64   Pulse (!) 101   Ht 5\' 11"  (1.803 m)   Wt  193 lb 3.2 oz (87.6 kg)   SpO2 99%   BMI 26.95 kg/m   Wt Readings from Last 3 Encounters:  07/31/21 193 lb 3.2 oz (87.6 kg)  04/30/21 189 lb (85.7 kg)  12/04/20 193 lb 6.4 oz (87.7 kg)    Physical Exam Vitals and nursing note reviewed.  Constitutional:      General: He is not in acute distress.    Appearance: He is well-developed. He is not diaphoretic.     Comments: Well-appearing, comfortable, cooperative  HENT:     Head: Normocephalic and atraumatic.  Eyes:     General:        Right eye: No discharge.        Left eye: No discharge.     Conjunctiva/sclera: Conjunctivae normal.     Pupils: Pupils are equal, round, and reactive to light.  Neck:     Thyroid: No thyromegaly.  Cardiovascular:     Rate and Rhythm: Normal rate and regular rhythm.     Pulses: Normal pulses.     Heart sounds: Normal heart sounds. No murmur heard. Pulmonary:     Effort: Pulmonary effort is normal. No respiratory distress.     Breath sounds: Normal breath sounds. No wheezing or rales.  Abdominal:     General: Bowel sounds are normal. There is no distension.     Palpations: Abdomen is soft. There is no mass.     Tenderness: There is no abdominal tenderness.  Musculoskeletal:        General: No tenderness. Normal range of motion.     Cervical back: Normal range of motion and neck supple.     Comments: Upper / Lower Extremities: - Normal muscle tone, strength bilateral upper extremities 5/5, lower extremities 5/5   Lymphadenopathy:     Cervical: No cervical adenopathy.  Skin:    General: Skin is warm and dry.     Findings: No erythema or rash.  Neurological:     Mental Status: He is alert and oriented to person, place, and time.     Comments: Distal sensation intact to light touch all extremities  Psychiatric:        Mood and Affect: Mood normal.        Behavior: Behavior normal.        Thought Content: Thought content normal.     Comments: Well groomed, good eye contact, normal speech and thoughts      Results for orders placed or performed in visit on 04/30/21  HIV Antibody (routine testing w rflx)  Result Value Ref Range   HIV 1&2 Ab, 4th Generation NON-REACTIVE NON-REACTIVE  RPR  Result Value Ref Range   RPR Ser Ql NON-REACTIVE NON-REACTIVE  POCT HgB A1C  Result Value Ref Range   Hemoglobin A1C 5.6 4.0 - 5.6 %  GC/Chlamydia probe amp ([place])not at [facility]  Result Value Ref Range   Neisseria Gonorrhea Negative    Chlamydia Negative    Comment Normal Reference Ranger Chlamydia - Negative    Comment      Normal Reference Range Neisseria Gonorrhea - Negative      Assessment & Plan:   Problem List Items Addressed This Visit     Pre-diabetes    Stable A1c well controlled at 5.6 previously off GLP1 Due for repeat labs now Concern with HLD  Plan:  Remain off GLP1 reconsider Wegovy GLP1 in 2023  Encourage improved lifestyle - low carb, low sugar diet, reduce portion size, continue improving regular exercise  Paroxysmal atrial fibrillation (HCC)    Followed by [facility] Had prior symptomatic episode Afib on Eliquis anticoagulation      Mixed hyperlipidemia    Due lipid panel Previously uncontrolled Off statin      Gout    Due for repeat Uric Acid, prior range improved Not on uric acid lowering therapy currently, has declined allopurinol      Other Visit Diagnoses     Annual physical exam    -  Primary   Aortic atherosclerosis ([place])            Updated Health Maintenance information Labs ordered, follow up results Encouraged improvement to lifestyle with diet and exercise  Goal of weight loss  Aortic Atherosclerosis On prior imaging, stable.  No orders of the defined types were placed in this encounter.     Follow up plan: Return in about 6 months (around 01/29/2022) for 6 month follow-up PreDM (Lab or POC A1c), consider restart wegovy, STD Screening tests.  Nobie Putnam, DO [facility] 07/31/2021, 1:53 PM

## 2021-07-31 NOTE — Assessment & Plan Note (Signed)
Due lipid panel Previously uncontrolled Off statin

## 2021-07-31 NOTE — Patient Instructions (Addendum)
Thank you for coming to the office today.  Shimpi, Rudi Coco, MD [place], [place] [id number] [phone] (Work)  Diagnosis = Nocturia (ICD10 code is R35.1)  Please schedule a Follow-up Appointment to: Return in about 6 months (around 01/29/2022) for 6 month follow-up PreDM (Lab or POC A1c), consider restart wegovy, STD Screening tests.  If you have any other questions or concerns, please feel free to call the office or send a message through [place]. You may also schedule an earlier appointment if necessary.  Additionally, you may be receiving a survey about your experience at our office within a few days to 1 week by e-mail or mail. We value your feedback.  Nobie Putnam, DO [place]

## 2021-07-31 NOTE — Assessment & Plan Note (Signed)
Due for repeat Uric Acid, prior range improved Not on uric acid lowering therapy currently, has declined allopurinol

## 2021-07-31 NOTE — Assessment & Plan Note (Signed)
Followed by [facility] Had prior symptomatic episode Afib on Eliquis anticoagulation

## 2021-08-01 LAB — CBC WITH DIFFERENTIAL/PLATELET
Absolute Monocytes: 815 cells/uL (ref 200–950)
Basophils Absolute: 67 cells/uL (ref 0–200)
Basophils Relative: 0.8 %
Eosinophils Absolute: 160 cells/uL (ref 15–500)
Eosinophils Relative: 1.9 %
HCT: 47.4 % (ref 38.5–50.0)
Hemoglobin: 16.1 g/dL (ref 13.2–17.1)
Lymphs Abs: 2058 cells/uL (ref 850–3900)
MCH: 32.7 pg (ref 27.0–33.0)
MCHC: 34 g/dL (ref 32.0–36.0)
MCV: 96.3 fL (ref 80.0–100.0)
MPV: 11.1 fL (ref 7.5–12.5)
Monocytes Relative: 9.7 %
Neutro Abs: 5300 cells/uL (ref 1500–7800)
Neutrophils Relative %: 63.1 %
Platelets: 280 10*3/uL (ref 140–400)
RBC: 4.92 10*6/uL (ref 4.20–5.80)
RDW: 12.3 % (ref 11.0–15.0)
Total Lymphocyte: 24.5 %
WBC: 8.4 10*3/uL (ref 3.8–10.8)

## 2021-08-01 LAB — COMPLETE METABOLIC PANEL WITH GFR
AG Ratio: 1.9 (calc) (ref 1.0–2.5)
ALT: 27 U/L (ref 9–46)
AST: 22 U/L (ref 10–35)
Albumin: 4.2 g/dL (ref 3.6–5.1)
Alkaline phosphatase (APISO): 52 U/L (ref 35–144)
BUN: 11 mg/dL (ref 7–25)
CO2: 28 mmol/L (ref 20–32)
Calcium: 9.2 mg/dL (ref 8.6–10.3)
Chloride: 106 mmol/L (ref 98–110)
Creat: 0.86 mg/dL (ref 0.70–1.30)
Globulin: 2.2 g/dL (calc) (ref 1.9–3.7)
Glucose, Bld: 100 mg/dL — ABNORMAL HIGH (ref 65–99)
Potassium: 4.4 mmol/L (ref 3.5–5.3)
Sodium: 142 mmol/L (ref 135–146)
Total Bilirubin: 0.5 mg/dL (ref 0.2–1.2)
Total Protein: 6.4 g/dL (ref 6.1–8.1)
eGFR: 100 mL/min/{1.73_m2} (ref >=60)

## 2021-08-01 LAB — LIPID PANEL
Cholesterol: 208 mg/dL — ABNORMAL HIGH (ref <200)
HDL: 54 mg/dL (ref >=40)
LDL Cholesterol (Calc): 129 mg/dL (calc) — ABNORMAL HIGH
Non-HDL Cholesterol (Calc): 154 mg/dL (calc) — ABNORMAL HIGH (ref <130)
Total CHOL/HDL Ratio: 3.9 (calc) (ref <5.0)
Triglycerides: 140 mg/dL (ref <150)

## 2021-08-01 LAB — URIC ACID: Uric Acid, Serum: 7.3 mg/dL (ref 4.0–8.0)

## 2021-08-01 LAB — HEMOGLOBIN A1C
Hgb A1c MFr Bld: 5.5 % of total Hgb (ref <5.7)
Mean Plasma Glucose: 111 mg/dL
eAG (mmol/L): 6.2 mmol/L

## 2021-08-22 ENCOUNTER — Other Ambulatory Visit: Payer: Self-pay | Admitting: Family Medicine

## 2021-08-22 DIAGNOSIS — K219 Gastro-esophageal reflux disease without esophagitis: Secondary | ICD-10-CM

## 2021-08-22 NOTE — Telephone Encounter (Signed)
Requested Prescriptions  Pending Prescriptions Disp Refills  . omeprazole (PRILOSEC) 20 MG capsule [Pharmacy Med Name: OMEPRAZOLE 20MG  CAPSULES] 90 capsule 3    Sig: TAKE 1 CAPSULE(20 MG) BY MOUTH DAILY BEFORE BREAKFAST     Gastroenterology: Proton Pump Inhibitors Passed - 08/22/2021  7:24 PM      Passed - Valid encounter within last 12 months    Recent Outpatient Visits          3 weeks ago Annual physical exam   [place], DO   3 months ago Pre-diabetes   [place], DO   8 months ago Pre-diabetes   [place], DO   11 months ago Obesity (BMI 30.0-34.9)   [facility] Parks Ranger, Devonne Doughty, DO   1 year ago Annual physical exam   [place], DO      Future Appointments            In 5 months Parks Ranger, Devonne Doughty, DO [facility], [facility]

## 2021-08-28 DIAGNOSIS — I480 Paroxysmal atrial fibrillation: Secondary | ICD-10-CM | POA: Diagnosis not present

## 2021-09-03 DIAGNOSIS — N200 Calculus of kidney: Secondary | ICD-10-CM | POA: Diagnosis not present

## 2021-10-23 ENCOUNTER — Other Ambulatory Visit: Payer: Self-pay | Admitting: Family Medicine

## 2021-10-23 DIAGNOSIS — M1009 Idiopathic gout, multiple sites: Secondary | ICD-10-CM

## 2021-10-23 NOTE — Telephone Encounter (Signed)
Requested medication (s) are due for refill today - yes- expired rx  Requested medication (s) are on the active medication list -yes  Future visit scheduled -yes  Last refill: 01/17/20 #30 3RF  Notes to clinic: Request RF: expired Rx  Requested Prescriptions  Pending Prescriptions Disp Refills   colchicine 0.6 MG tablet [Pharmacy Med Name: COLCHICINE 0.6MG  TABLETS] 30 tablet 3    Sig: START WITH 2 TABLETS IF GOUT FLARE, MAY REPEAT 1 TABLET IN 2 HOURS, THEN CAN TAKE 1 TABLET EVERY DAY FOR UP TO 7 TO 10 DAYS     Endocrinology:  Gout Agents Passed - 10/23/2021  7:06 AM      Passed - Uric Acid in normal range and within 360 days    Uric Acid, Serum  Date Value Ref Range Status  07/31/2021 7.3 4.0 - 8.0 mg/dL Final    Comment:    Therapeutic target for gout patients: <6.0 mg/dL .           Passed - Cr in normal range and within 360 days    Creat  Date Value Ref Range Status  07/31/2021 0.86 0.70 - 1.30 mg/dL Final          Passed - Valid encounter within last 12 months    Recent Outpatient Visits           2 months ago Annual physical exam   [facility] Olin Hauser, DO   5 months ago Pre-diabetes   [facility] Olin Hauser, DO   10 months ago Pre-diabetes   [place], DO   1 year ago Obesity (BMI 30.0-34.9)   [facility] Parks Ranger, Devonne Doughty, DO   1 year ago Annual physical exam   [place], DO       Future Appointments             In 3 months Parks Ranger, Devonne Doughty, DO [facility], [facility]               Requested Prescriptions  Pending Prescriptions Disp Refills   colchicine 0.6 MG tablet [Pharmacy Med Name: COLCHICINE 0.6MG  TABLETS] 30 tablet 3    Sig: START WITH 2 TABLETS IF GOUT FLARE, MAY REPEAT 1 TABLET IN 2 HOURS, THEN CAN TAKE 1 TABLET EVERY DAY FOR UP TO 7 TO 10 DAYS      Endocrinology:  Gout Agents Passed - 10/23/2021  7:06 AM      Passed - Uric Acid in normal range and within 360 days    Uric Acid, Serum  Date Value Ref Range Status  07/31/2021 7.3 4.0 - 8.0 mg/dL Final    Comment:    Therapeutic target for gout patients: <6.0 mg/dL .           Passed - Cr in normal range and within 360 days    Creat  Date Value Ref Range Status  07/31/2021 0.86 0.70 - 1.30 mg/dL Final          Passed - Valid encounter within last 12 months    Recent Outpatient Visits           2 months ago Annual physical exam   [place], DO   5 months ago [facility] Olin Hauser, DO   10 months ago Pre-diabetes   [facility] [place], Sheppard Coil  J, DO   1 year ago Obesity (BMI 30.0-34.9)   [facility] Parks Ranger, Devonne Doughty, DO   1 year ago Annual physical exam   [place], DO       Future Appointments             In 3 months Parks Ranger, Devonne Doughty, DO [facility], [facility]

## 2021-11-19 ENCOUNTER — Telehealth: Payer: Self-pay

## 2021-11-19 NOTE — Telephone Encounter (Signed)
Copied from [place] [phone]. Topic: General - Inquiry >> Nov 15, 2021  9:40 AM Alec Hurst wrote: Reason for CRM:  Patient called wanting to get a print out of all charges for 2022 for tax purposes as well as Medical Reimbursement from his work that must be completed by Monday 11/19/21. He also wants to know if this will include lab charges or if he needs to reach out to someone else for those. Patient would like these sent through [place] or to his email address at:  [email or web address]  Best contact number for patient is:  [phone].

## 2021-12-03 ENCOUNTER — Ambulatory Visit: Payer: BC Managed Care – PPO | Admitting: Family Medicine

## 2021-12-31 ENCOUNTER — Other Ambulatory Visit: Payer: Self-pay

## 2021-12-31 ENCOUNTER — Ambulatory Visit: Payer: BC Managed Care – PPO | Admitting: Family Medicine

## 2021-12-31 ENCOUNTER — Encounter: Payer: Self-pay | Admitting: Family Medicine

## 2021-12-31 ENCOUNTER — Ambulatory Visit (INDEPENDENT_AMBULATORY_CARE_PROVIDER_SITE_OTHER): Payer: BC Managed Care – PPO | Admitting: Family Medicine

## 2021-12-31 VITALS — BP 152/74 | HR 68 | Ht 71.0 in | Wt 202.0 lb

## 2021-12-31 DIAGNOSIS — R7303 Prediabetes: Secondary | ICD-10-CM

## 2021-12-31 DIAGNOSIS — E669 Obesity, unspecified: Secondary | ICD-10-CM

## 2021-12-31 LAB — POCT GLYCOSYLATED HEMOGLOBIN (HGB A1C): Hemoglobin A1C: 6.6 % — AB (ref 4.0–5.6)

## 2021-12-31 MED ORDER — WEGOVY 0.25 MG/0.5ML ~~LOC~~ SOAJ: 0.2500 mg | SUBCUTANEOUS | 0 refills | Status: DC

## 2021-12-31 NOTE — Progress Notes (Signed)
? ?Subjective:  ? ? Patient ID: Alec Hurst, male    DOB: [birth year], 61 y.o.   MRN: [id number] ? ?Alec Hurst is a 61 y.o. male presenting on 12/31/2021 for Obesity ? ? ?HPI ? ?Pre-Diabetes / Overweight BMI >28 ?off wegovy and ozempic for months now, has some inc hunger but overall doing well, some wt gain. ?Now ready to restart medication GLP1 ?He has had weight gain as described below ?Prior A1c 5 to 6+ range ?Now due today ?CBGs: Not checking CBG ?Meds: OFF Metformin 500 BID, he self discontinued, Off Ozempic now still ?Currently not on ACEi/ARB ?Lifestyle: ?Previously down to 170 range with Wegovy, now weight gradually increased up to >200 lbs ?- Diet (balanced diet, no particular diet plan)  ?- Exercise (working on improving regular exercise) ? ? ?Off Diltiazem, Eliquis ?Only on Propranolol did not take today ? ? ?Depression screen [facility] 2/9 12/31/2021 04/30/2021 08/31/2020  ?Decreased Interest 0 0 0  ?Down, Depressed, Hopeless 0 0 0  ?PHQ - 2 Score 0 0 0  ?Altered sleeping 0 0 -  ?Tired, decreased energy 0 2 -  ?Change in appetite 0 0 -  ?Feeling bad or failure about yourself  0 0 -  ?Trouble concentrating 0 0 -  ?Moving slowly or fidgety/restless 0 0 -  ?Suicidal thoughts - 0 -  ?PHQ-9 Score 0 2 -  ?Difficult doing work/chores Not difficult at all - -  ? ? ?Social History  ? ?Tobacco Use  ? Smoking status: Never  ? Smokeless tobacco: Never  ?Vaping Use  ? Vaping Use: Never used  ?Substance Use Topics  ? Alcohol use: Yes  ?  Alcohol/week: 0.0 standard drinks  ?  Comment: occasional  ? Drug use: No  ? ? ?Review of Systems ?Per HPI unless specifically indicated above ? ?   ?Objective:  ?  ?BP (!) 152/74 (BP Location: Left Arm, Cuff Size: Normal)   Pulse 68   Ht '5\' 11"'$  (1.803 m)   Wt 202 lb (91.6 kg)   SpO2 99%   BMI 28.17 kg/m?   ?Wt Readings from Last 3 Encounters:  ?12/31/21 202 lb (91.6 kg)  ?07/31/21 193 lb 3.2 oz (87.6 kg)  ?04/30/21 189 lb (85.7 kg)  ?  ?Physical Exam ?Vitals and nursing note  reviewed.  ?Constitutional:   ?   General: He is not in acute distress. ?   Appearance: Normal appearance. He is well-developed. He is not diaphoretic.  ?   Comments: Well-appearing, comfortable, cooperative  ?HENT:  ?   Head: Normocephalic and atraumatic.  ?Eyes:  ?   General:     ?   Right eye: No discharge.     ?   Left eye: No discharge.  ?   Conjunctiva/sclera: Conjunctivae normal.  ?Cardiovascular:  ?   Rate and Rhythm: Normal rate.  ?Pulmonary:  ?   Effort: Pulmonary effort is normal.  ?Skin: ?   General: Skin is warm and dry.  ?   Findings: No erythema or rash.  ?Neurological:  ?   Mental Status: He is alert and oriented to person, place, and time.  ?Psychiatric:     ?   Mood and Affect: Mood normal.     ?   Behavior: Behavior normal.     ?   Thought Content: Thought content normal.  ?   Comments: Well groomed, good eye contact, normal speech and thoughts  ? ?Recent Labs  ?  04/30/21 ?0973 07/31/21 ?1355 12/31/21 ?1341  ?  HGBA1C 5.6 5.5 6.6*  ? ? ? ?Results for orders placed or performed in visit on 12/31/21  ?POCT HgB A1C  ?Result Value Ref Range  ? Hemoglobin A1C 6.6 (A) 4.0 - 5.6 %  ? ?   ?Assessment & Plan:  ? ?Problem List Items Addressed This Visit   ? ? Pre-diabetes - Primary  ?  A1c up to 6.6, prior 5 range on GLP1 ?Wt gain again, ready to restart therapy ?Concern with HLD ? ?Plan:  ?Restart Wegovy sample 0.'25mg'$  weekly x 4 weeks, then notify when ready, can re order 0.'25mg'$  weekly through insurance, adjust dose monthly as tolerated up for several months until reaches goal ? ?Encourage improved lifestyle - low carb, low sugar diet, reduce portion size, continue improving regular exercise ? ?Discussion that in future if still elevated or recurrence >6.5 would consider T2DM ?  ?  ? Relevant Medications  ? WEGOVY 0.25 MG/0.5ML SOAJ  ? Other Relevant Orders  ? POCT HgB A1C (Completed)  ? ?Other Visit Diagnoses   ? ? Obesity (BMI 30.0-34.9)      ? Relevant Medications  ? WEGOVY 0.25 MG/0.5ML SOAJ  ? ?  ?   ?Meds ordered this encounter  ?Medications  ? WEGOVY 0.25 MG/0.5ML SOAJ  ?  Sig: Inject 0.25 mg into the skin once a week.  ?  Dispense:  2 mL  ?  Refill:  0  ? ? ? ? ?Follow up plan: ?Return in about 3 months (around 04/02/2022) for 3 month PreDM A1c, Wegovy dosing. ? ? ?Nobie Putnam, DO ?[facility] ?[facility] ?12/31/2021, 1:38 PM ?

## 2021-12-31 NOTE — Assessment & Plan Note (Addendum)
A1c up to 6.6, prior 5 range on GLP1 ?Wt gain again, ready to restart therapy ?Concern with HLD ? ?Plan:  ?Restart Wegovy sample 0.'25mg'$  weekly x 4 weeks, then notify when ready, can re order 0.'25mg'$  weekly through insurance, adjust dose monthly as tolerated up for several months until reaches goal ? ?Encourage improved lifestyle - low carb, low sugar diet, reduce portion size, continue improving regular exercise ? ?Discussion that in future if still elevated or recurrence >6.5 would consider T2DM ?

## 2021-12-31 NOTE — Patient Instructions (Addendum)
Thank you for coming to the office today. ? ?Double check w/ Dr Reesa Chew office at [facility], if you can go ahead and re-schedule on your own. If you need a new referral we can order it, just let me know ? ?Restart Wegovy 0.'25mg'$  weekly inj for 1 month sample, then we can re order same dose when ready, message on mychart. ? ?Recent Labs  ?  04/30/21 ?4403 07/31/21 ?1355 12/31/21 ?1341  ?HGBA1C 5.6 5.5 6.6*  ? ? ? ?Please schedule a Follow-up Appointment to: Return in about 3 months (around 04/02/2022) for 3 month PreDM A1c, Wegovy dosing. ? ?If you have any other questions or concerns, please feel free to call the office or send a message through [place]. You may also schedule an earlier appointment if necessary. ? ?Additionally, you may be receiving a survey about your experience at our office within a few days to 1 week by e-mail or mail. We value your feedback. ? ?Nobie Putnam, DO ?[place] ?

## 2022-01-21 ENCOUNTER — Other Ambulatory Visit: Payer: Self-pay | Admitting: Family Medicine

## 2022-01-21 DIAGNOSIS — E669 Obesity, unspecified: Secondary | ICD-10-CM

## 2022-01-21 DIAGNOSIS — R7303 Prediabetes: Secondary | ICD-10-CM

## 2022-01-22 MED ORDER — WEGOVY 0.25 MG/0.5ML ~~LOC~~ SOAJ: 0.2500 mg | SUBCUTANEOUS | 0 refills | Status: DC

## 2022-01-24 DIAGNOSIS — Z125 Encounter for screening for malignant neoplasm of prostate: Secondary | ICD-10-CM | POA: Diagnosis not present

## 2022-02-04 ENCOUNTER — Ambulatory Visit: Payer: BC Managed Care – PPO | Admitting: Family Medicine

## 2022-02-05 DIAGNOSIS — Z125 Encounter for screening for malignant neoplasm of prostate: Secondary | ICD-10-CM | POA: Diagnosis not present

## 2022-02-05 DIAGNOSIS — N200 Calculus of kidney: Secondary | ICD-10-CM | POA: Diagnosis not present

## 2022-02-05 DIAGNOSIS — N5203 Combined arterial insufficiency and corporo-venous occlusive erectile dysfunction: Secondary | ICD-10-CM | POA: Diagnosis not present

## 2022-02-20 ENCOUNTER — Other Ambulatory Visit: Payer: Self-pay | Admitting: Family Medicine

## 2022-02-20 DIAGNOSIS — E669 Obesity, unspecified: Secondary | ICD-10-CM

## 2022-02-20 DIAGNOSIS — R7303 Prediabetes: Secondary | ICD-10-CM

## 2022-02-21 MED ORDER — WEGOVY 0.5 MG/0.5ML ~~LOC~~ SOAJ: 0.5000 mg | SUBCUTANEOUS | 0 refills | Status: DC

## 2022-03-05 DIAGNOSIS — L738 Other specified follicular disorders: Secondary | ICD-10-CM | POA: Diagnosis not present

## 2022-03-05 DIAGNOSIS — D1801 Hemangioma of skin and subcutaneous tissue: Secondary | ICD-10-CM | POA: Diagnosis not present

## 2022-03-05 DIAGNOSIS — L570 Actinic keratosis: Secondary | ICD-10-CM | POA: Diagnosis not present

## 2022-03-05 DIAGNOSIS — D485 Neoplasm of uncertain behavior of skin: Secondary | ICD-10-CM | POA: Diagnosis not present

## 2022-03-05 DIAGNOSIS — L821 Other seborrheic keratosis: Secondary | ICD-10-CM | POA: Diagnosis not present

## 2022-03-05 DIAGNOSIS — L720 Epidermal cyst: Secondary | ICD-10-CM | POA: Diagnosis not present

## 2022-03-05 DIAGNOSIS — L578 Other skin changes due to chronic exposure to nonionizing radiation: Secondary | ICD-10-CM | POA: Diagnosis not present

## 2022-03-12 DIAGNOSIS — L814 Other melanin hyperpigmentation: Secondary | ICD-10-CM | POA: Diagnosis not present

## 2022-03-12 DIAGNOSIS — L0889 Other specified local infections of the skin and subcutaneous tissue: Secondary | ICD-10-CM | POA: Diagnosis not present

## 2022-03-12 DIAGNOSIS — L720 Epidermal cyst: Secondary | ICD-10-CM | POA: Diagnosis not present

## 2022-03-14 IMAGING — US US AORTA
1 series · 13 of 25 positions shown · non-contrast
Comparison: None.

CLINICAL DATA: Ectasia of the abdominal aorta seen on outside MRI.
Evaluate for abdominal aortic aneurysm.

EXAM:
ULTRASOUND OF ABDOMINAL AORTA
TECHNIQUE: Ultrasound examination of the abdominal aorta and proximal common
iliac arteries was performed to evaluate for aneurysm. Additional
color and Doppler images of the distal aorta were obtained to
document patency.

[Series 1: us aorta duplex limited · 13 of 31 slices shown]
[im 1/31]
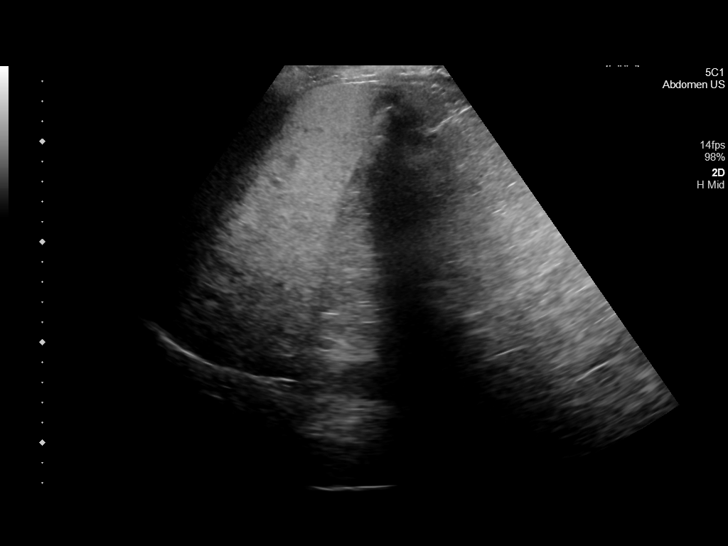
[im 3/31]
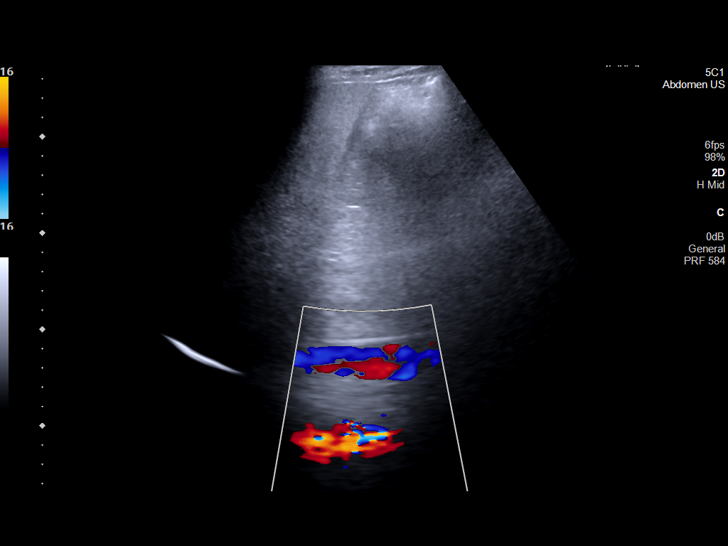
[im 6/31]
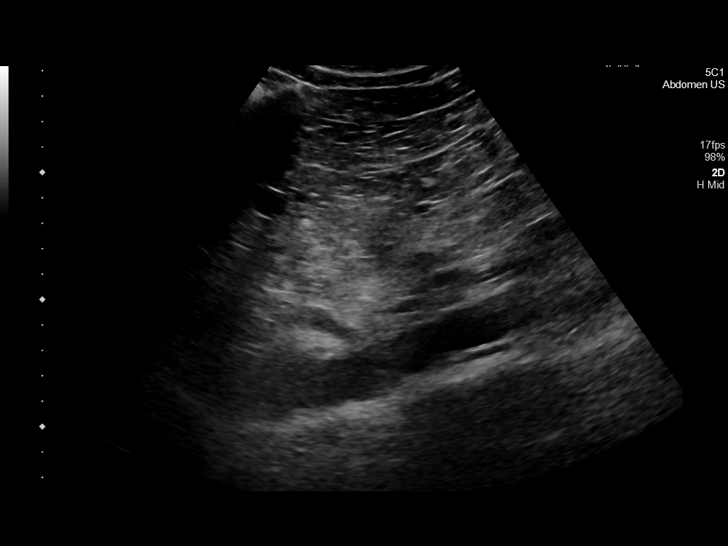
[im 8/31]
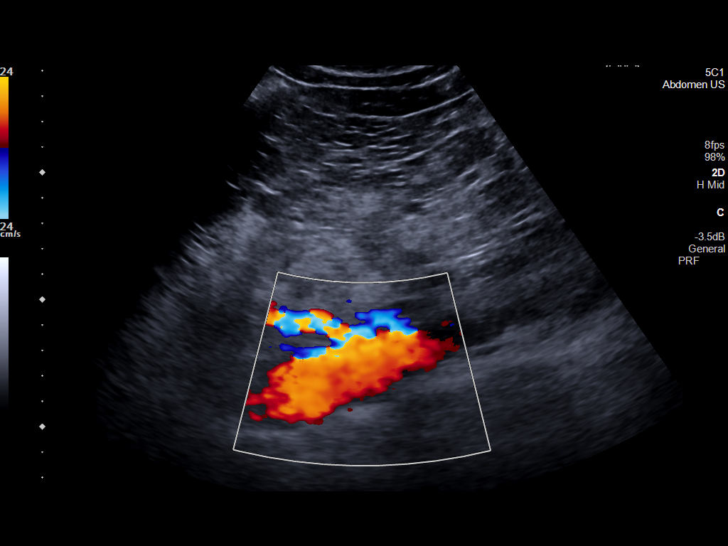
[im 11/31]
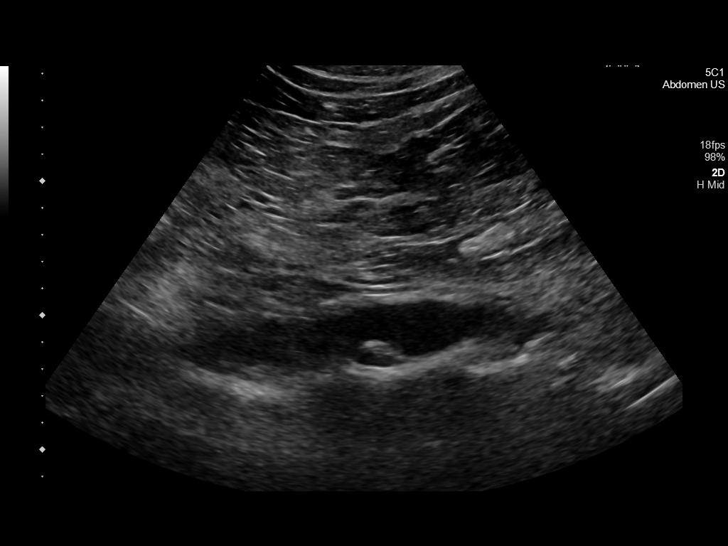
[im 13/31]
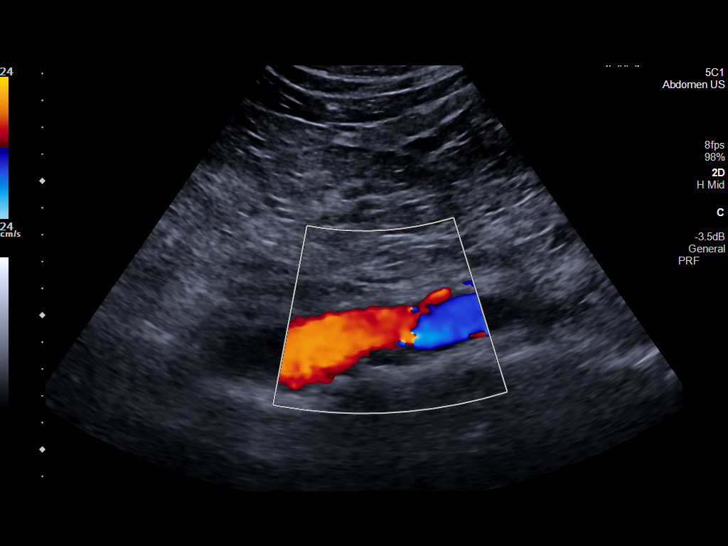
[im 16/31]
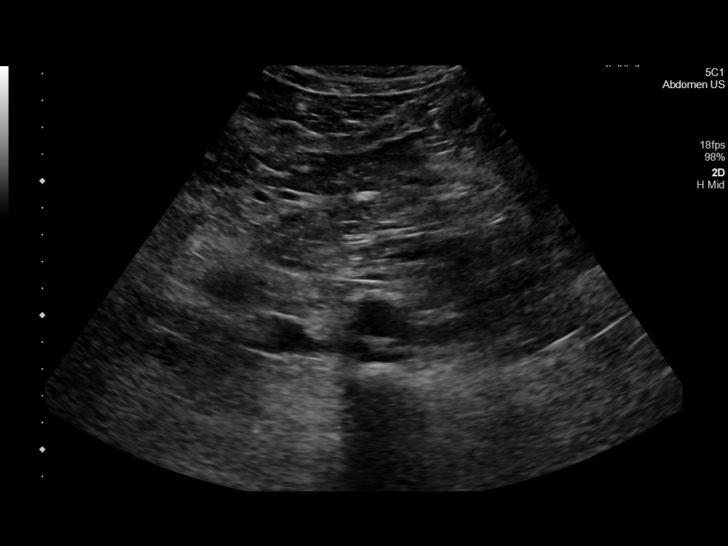
[im 18/31]
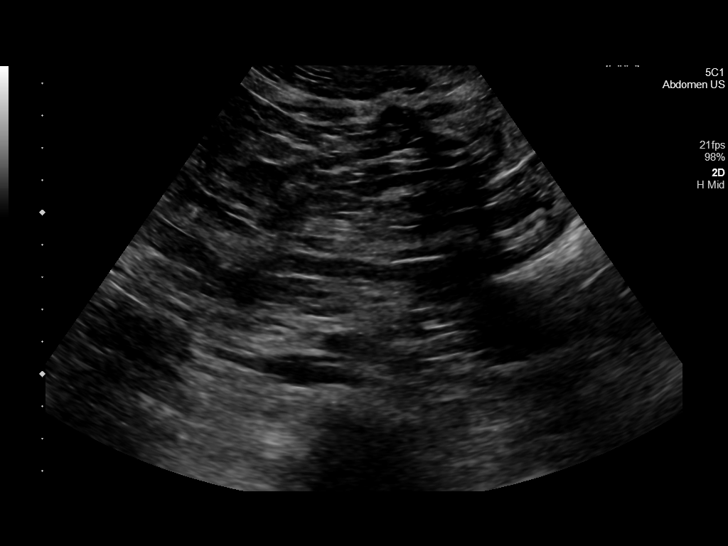
[im 21/31]
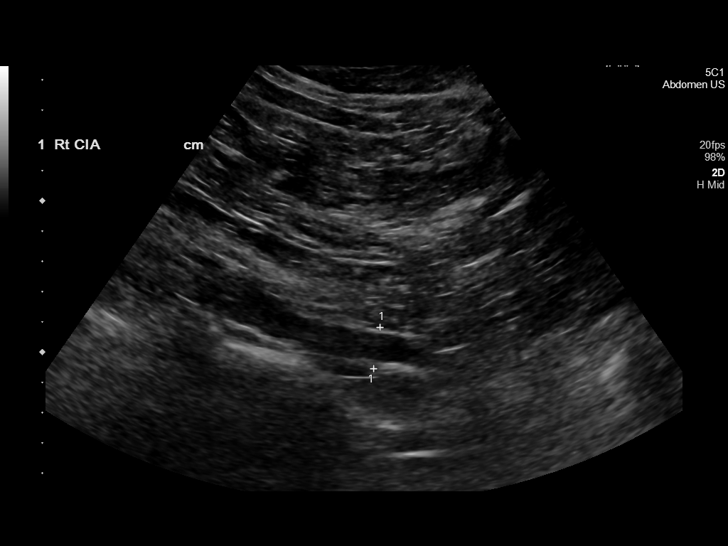
[im 23/31]
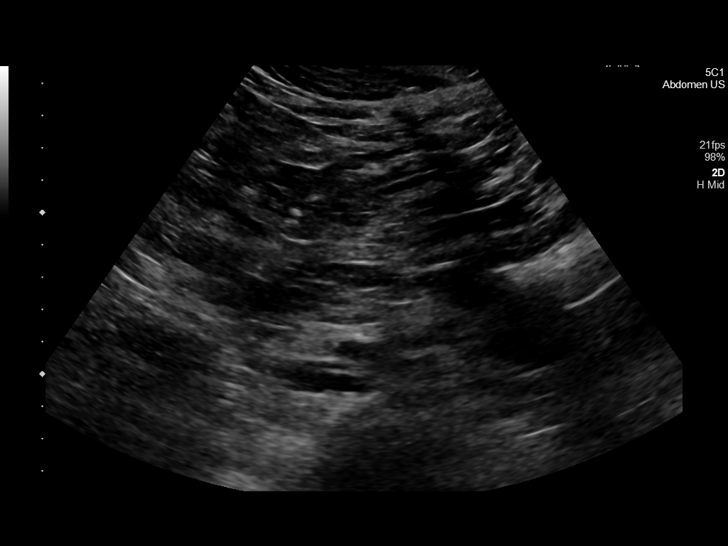
[im 26/31]
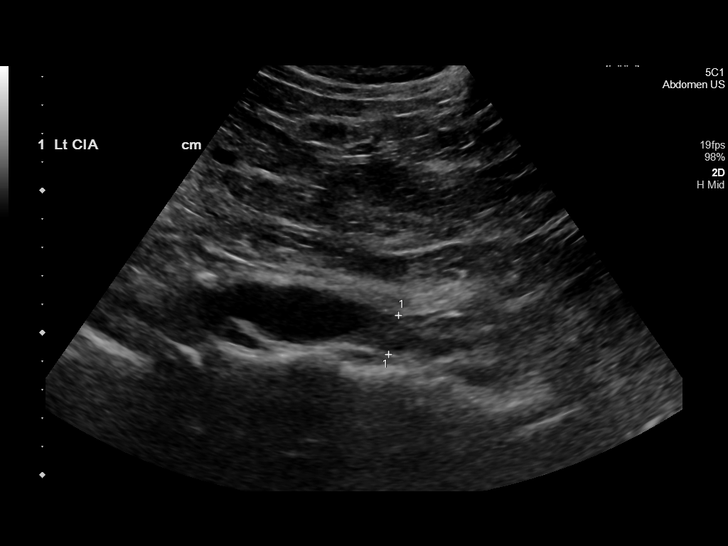
[im 28/31]
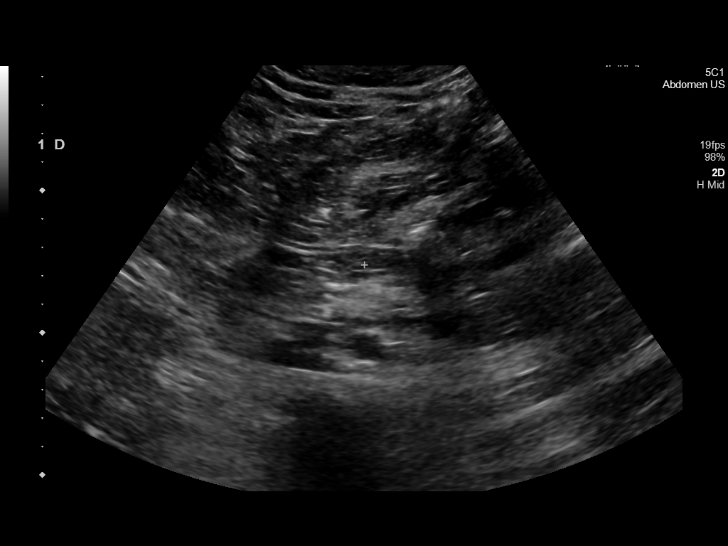
[im 31/31]
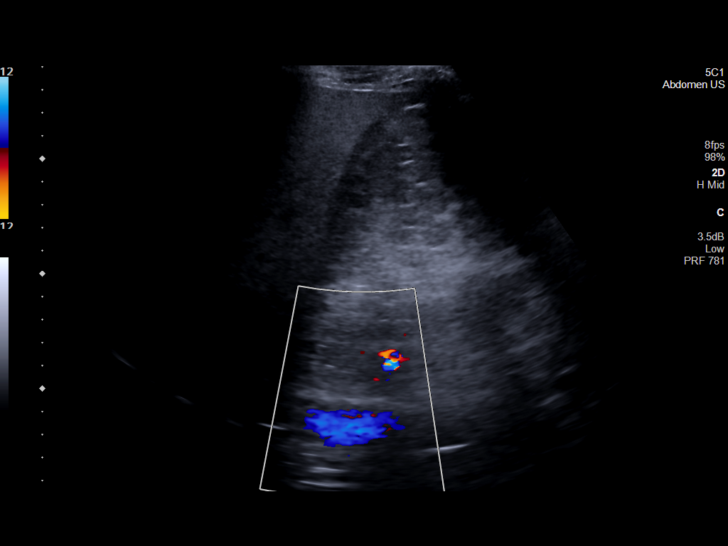

[13 of 25 positions shown; findings below may reference images not displayed]

FINDINGS: Abdominal aortic measurements as follows:

Proximal:  3.1 x 2.9 cm

Mid:  2.2 x 2.2 cm

Distal:  2.8 x 2.7 cm
Patent: Yes, peak systolic velocity is 62 cm/s

There is a moderate amount of eccentric mixed echogenic plaque
involving the mid and distal aspects of the abdominal aorta
(representative image 12).

Right common iliac artery: 1.4 x 1.3 cm

Left common iliac artery: 1.4 x 1.3 cm

There is diffuse increased slightly coarsened echogenicity of the
hepatic parenchyma suggestive of hepatic steatosis.
IMPRESSION: 1. Mild ectasia of the distal aspect of the abdominal aorta
measuring 2.8 cm in diameter. Recommend follow-up aortic ultrasound
in 5 years. This recommendation follows ACR consensus guidelines:
White Paper of the ACR Incidental Findings Committee II on Vascular
Findings. [HOSPITAL] 3029; [DATE].
2.  Aortic Atherosclerosis ([id number]).
3. Increased slightly coarsened echogenicity of the hepatic
parenchyma suggestive of hepatic steatosis. Correlation with LFTs is
advised.

## 2022-03-26 ENCOUNTER — Encounter: Payer: Self-pay | Admitting: Family Medicine

## 2022-03-26 DIAGNOSIS — E669 Obesity, unspecified: Secondary | ICD-10-CM

## 2022-03-27 MED ORDER — WEGOVY 1 MG/0.5ML ~~LOC~~ SOAJ: 1.0000 mg | SUBCUTANEOUS | 2 refills | Status: DC

## 2022-03-27 NOTE — Addendum Note (Signed)
Addended by: Olin Hauser on: 03/27/2022 12:30 PM   Modules accepted: Orders

## 2022-04-29 ENCOUNTER — Ambulatory Visit: Payer: BC Managed Care – PPO | Admitting: Family Medicine

## 2022-05-13 ENCOUNTER — Encounter: Payer: Self-pay | Admitting: Family Medicine

## 2022-05-13 ENCOUNTER — Ambulatory Visit (INDEPENDENT_AMBULATORY_CARE_PROVIDER_SITE_OTHER): Payer: BC Managed Care – PPO | Admitting: Family Medicine

## 2022-05-13 ENCOUNTER — Other Ambulatory Visit: Payer: Self-pay | Admitting: Family Medicine

## 2022-05-13 VITALS — BP 140/78 | HR 81 | Ht 71.0 in | Wt 200.8 lb

## 2022-05-13 DIAGNOSIS — E1169 Type 2 diabetes mellitus with other specified complication: Secondary | ICD-10-CM

## 2022-05-13 DIAGNOSIS — R351 Nocturia: Secondary | ICD-10-CM

## 2022-05-13 DIAGNOSIS — M1009 Idiopathic gout, multiple sites: Secondary | ICD-10-CM

## 2022-05-13 DIAGNOSIS — E785 Hyperlipidemia, unspecified: Secondary | ICD-10-CM

## 2022-05-13 DIAGNOSIS — Z0000 Encounter for general adult medical examination without abnormal findings: Secondary | ICD-10-CM

## 2022-05-13 DIAGNOSIS — E663 Overweight: Secondary | ICD-10-CM

## 2022-05-13 DIAGNOSIS — I480 Paroxysmal atrial fibrillation: Secondary | ICD-10-CM

## 2022-05-13 LAB — POCT GLYCOSYLATED HEMOGLOBIN (HGB A1C): Hemoglobin A1C: 6.7 % — AB (ref 4.0–5.6)

## 2022-05-13 MED ORDER — MOUNJARO 5 MG/0.5ML ~~LOC~~ SOAJ: 5.0000 mg | SUBCUTANEOUS | 0 refills | Status: DC

## 2022-05-13 NOTE — Assessment & Plan Note (Signed)
Elevated A1c still at 6.7 today, based on prior 6.6 and 6.9 documented in past 12 months, new diagnosis today of Type 2 Diabetes Weight increased, off GLP1 therapy for weight management Concern with HLD Past on Metformin  Plan:  Start Mounjaro 2.'5mg'$  weekly x 4 week dosing trial, sample given today New rx Mounjaro '5mg'$  weekly inj x 4 week course ordered, will hear from patient on progress, can adjust dose monthly until reaches preferred dose  Encourage improved lifestyle - low carb, low sugar diet, reduce portion size, continue improving regular exercise  Foot Exam today Urine microalbumin today Future will need Diabetic Eye Exam, discussion on this today Will consider Statin therapy for ASCVD risk reduction Will consider ACEi/ARB for kidney protection   Follow up

## 2022-05-13 NOTE — Progress Notes (Signed)
Subjective:    Patient ID: Alec Hurst, male    DOB: 03-Mar-1961, 61 y.o.   MRN: [id number]  Alec Hurst is a 61 y.o. male presenting on 05/13/2022 for Prediabetes   HPI  Type 2 Diabetes, new diagnosis Overweight BMI >28 off wegovy and ozempic for months now, has some inc hunger but overall doing well, some wt gain. Now ready to restart medication GLP1. Unable to get back on Wegovy since out of stock unavailable. He has had weight gain as described below Prior A1c up >6.5 in past, last time 6.6 and now A1c due CBGs: Not checking CBG Meds: Wegovy '1mg'$  weekly injection now off due to stock. OFF Metformin 500 BID, he self discontinued Currently not on ACEi/ARB Lifestyle: Previously down to 170 range with Wegovy, now weight gradually increased up to >200 lbs - Diet (balanced diet, no particular diet plan)  - Exercise (working on improving regular exercise)      12/31/2021    1:32 PM 04/30/2021    9:16 AM 08/31/2020    8:40 AM  Depression screen PHQ 2/9  Decreased Interest 0 0 0  Down, Depressed, Hopeless 0 0 0  PHQ - 2 Score 0 0 0  Altered sleeping 0 0   Tired, decreased energy 0 2   Change in appetite 0 0   Feeling bad or failure about yourself  0 0   Trouble concentrating 0 0   Moving slowly or fidgety/restless 0 0   Suicidal thoughts  0   PHQ-9 Score 0 2   Difficult doing work/chores Not difficult at all      Social History   Tobacco Use   Smoking status: Never   Smokeless tobacco: Never  Vaping Use   Vaping Use: Never used  Substance Use Topics   Alcohol use: Yes    Alcohol/week: 0.0 standard drinks of alcohol    Comment: occasional   Drug use: No    Review of Systems Per HPI unless specifically indicated above     Objective:    BP 140/78   Pulse 81   Ht '5\' 11"'$  (1.803 m)   Wt 200 lb 12.8 oz (91.1 kg)   SpO2 99%   BMI 28.01 kg/m   Wt Readings from Last 3 Encounters:  05/13/22 200 lb 12.8 oz (91.1 kg)  12/31/21 202 lb (91.6 kg)  07/31/21  193 lb 3.2 oz (87.6 kg)    Physical Exam Vitals and nursing note reviewed.  Constitutional:      General: He is not in acute distress.    Appearance: He is well-developed. He is not diaphoretic.     Comments: Well-appearing, comfortable, cooperative  HENT:     Head: Normocephalic and atraumatic.  Eyes:     General:        Right eye: No discharge.        Left eye: No discharge.     Conjunctiva/sclera: Conjunctivae normal.  Neck:     Thyroid: No thyromegaly.  Cardiovascular:     Rate and Rhythm: Normal rate and regular rhythm.     Pulses: Normal pulses.     Heart sounds: Normal heart sounds. No murmur heard. Pulmonary:     Effort: Pulmonary effort is normal. No respiratory distress.     Breath sounds: Normal breath sounds. No wheezing or rales.  Musculoskeletal:        General: Normal range of motion.     Cervical back: Normal range of motion and neck supple.  Right lower leg: No edema.     Left lower leg: No edema.  Lymphadenopathy:     Cervical: No cervical adenopathy.  Skin:    General: Skin is warm and dry.     Findings: No erythema or rash.  Neurological:     Mental Status: He is alert and oriented to person, place, and time. Mental status is at baseline.  Psychiatric:        Behavior: Behavior normal.     Comments: Well groomed, good eye contact, normal speech and thoughts      Diabetic Foot Exam - Simple   Simple Foot Form Diabetic Foot exam was performed with the following findings: Yes 05/13/2022 10:03 AM  Visual Inspection No deformities, no ulcerations, no other skin breakdown bilaterally: Yes Sensation Testing Intact to touch and monofilament testing bilaterally: Yes Pulse Check Posterior Tibialis and Dorsalis pulse intact bilaterally: Yes Comments      Results for orders placed or performed in visit on 05/13/22  POCT HgB A1C  Result Value Ref Range   Hemoglobin A1C 6.7 (A) 4.0 - 5.6 %   Recent Labs    07/31/21 1355 12/31/21 1341  05/13/22 0939  HGBA1C 5.5 6.6* 6.7*       Assessment & Plan:   Problem List Items Addressed This Visit     Hyperlipidemia associated with type 2 diabetes mellitus ([place])   Relevant Medications   MOUNJARO 5 MG/0.5ML Pen   Type 2 diabetes mellitus with other specified complication (HCC) - Primary    Elevated A1c still at 6.7 today, based on prior 6.6 and 6.9 documented in past 12 months, new diagnosis today of Type 2 Diabetes Weight increased, off GLP1 therapy for weight management Concern with HLD Past on Metformin  Plan:  Start Mounjaro 2.'5mg'$  weekly x 4 week dosing trial, sample given today New rx Mounjaro '5mg'$  weekly inj x 4 week course ordered, will hear from patient on progress, can adjust dose monthly until reaches preferred dose  Encourage improved lifestyle - low carb, low sugar diet, reduce portion size, continue improving regular exercise  Foot Exam today Urine microalbumin today Future will need Diabetic Eye Exam, discussion on this today Will consider Statin therapy for ASCVD risk reduction Will consider ACEi/ARB for kidney protection   Follow up      Relevant Medications   MOUNJARO 5 MG/0.5ML Pen   Other Relevant Orders   POCT HgB A1C (Completed)   Urine Microalbumin w/creat. ratio   Other Visit Diagnoses     Overweight (BMI 25.0-29.9)             Meds ordered this encounter  Medications   MOUNJARO 5 MG/0.5ML Pen    Sig: Inject 5 mg into the skin once a week.    Dispense:  2 mL    Refill:  0      Follow up plan: Return in about 3 months (around 08/13/2022) for 3 month fasting lab only then 1 week later Annual Physical.  Future labs ordered for 07/2022   Nobie Putnam, [facility] 05/13/2022, 9:32 AM

## 2022-05-13 NOTE — Patient Instructions (Addendum)
Thank you for coming to the office today.  New diagnosis Type 2 Diabetes, but I am encouraged we can manage this well.  Start Mounjaro 2.'5mg'$  weekly x 4 week dose on sample  I will order Mounjaro '5mg'$  weekly for 4 more doses or 1 month, we can adjust dose monthly and increase to 7.5 or 10 in future.  Recommend Diabetic Eye Exam from your eye doctor.  DUE for FASTING BLOOD WORK (no food or drink after midnight before the lab appointment, only water or coffee without cream/sugar on the morning of)  SCHEDULE "Lab Only" visit in the morning at the clinic for lab draw in 3 MONTHS   - Make sure Lab Only appointment is at about 1 week before your next appointment, so that results will be available  For Lab Results, once available within 2-3 days of blood draw, you can can log in to MyChart online to view your results and a brief explanation. Also, we can discuss results at next follow-up visit.   Please schedule a Follow-up Appointment to: Return in about 3 months (around 08/13/2022) for 3 month fasting lab only then 1 week later Annual Physical.  If you have any other questions or concerns, please feel free to call the office or send a message through [address]. You may also schedule an earlier appointment if necessary.  Additionally, you may be receiving a survey about your experience at our office within a few days to 1 week by e-mail or mail. We value your feedback.  Nobie Putnam, DO [place]

## 2022-05-14 LAB — MICROALBUMIN / CREATININE URINE RATIO
Creatinine, Urine: 144 mg/dL (ref 20–320)
Microalb Creat Ratio: 12 mcg/mg creat (ref <30)
Microalb, Ur: 1.7 mg/dL

## 2022-05-15 ENCOUNTER — Telehealth: Payer: Self-pay | Admitting: Family Medicine

## 2022-05-15 DIAGNOSIS — E1169 Type 2 diabetes mellitus with other specified complication: Secondary | ICD-10-CM

## 2022-05-15 NOTE — Telephone Encounter (Signed)
Can you clarify what he means? Do I need to order the rx to [facility] so that you can do PA? Or can you still do the PA and he fill it at [facility]?

## 2022-05-16 NOTE — Telephone Encounter (Signed)
The PA has been submitted. Waiting on approval/denial status from cover my meds.

## 2022-05-16 NOTE — Telephone Encounter (Signed)
PA has been approved

## 2022-05-16 NOTE — Telephone Encounter (Signed)
Ok, thank you. Closing this encounter. Would you be able to notify patient that PA was approved and hopefully he can get the medicine?  Thanks  Nobie Putnam, [facility] 05/16/2022, 11:42 AM

## 2022-05-16 NOTE — Telephone Encounter (Signed)
The patient is contacting their insurance company to get further clarity about their prior auth   Patient will call back to clarify where they would like their prior authorization submitted to

## 2022-05-17 NOTE — Telephone Encounter (Addendum)
Caller checking on the status of PA status and states that all medication PA should go through [facility] because [facility] is the pharmacy benefit manager.    [facility] PA phone # [phone]  [facility] PA website information: [email or web address]

## 2022-06-04 ENCOUNTER — Ambulatory Visit: Payer: Self-pay | Admitting: *Deleted

## 2022-06-04 NOTE — Patient Outreach (Signed)
  Care Coordination   06/04/2022 Name: Alec Hurst MRN: [id number] DOB: 11-Dec-1960   Care Coordination Outreach Attempts:  An unsuccessful telephone outreach was attempted today to offer the patient information about available care coordination services as a benefit of their health plan.   Follow Up Plan:  Additional outreach attempts will be made to offer the patient care coordination information and services.   Encounter Outcome:  No Answer  Care Coordination Interventions Activated:  No   Care Coordination Interventions:  No, not indicated    Hendrik Donath, [place] Worker  [facility] Care Management [phone]

## 2022-06-05 ENCOUNTER — Ambulatory Visit: Payer: Self-pay | Admitting: *Deleted

## 2022-06-05 ENCOUNTER — Telehealth: Payer: Self-pay | Admitting: Pharmacist

## 2022-06-05 NOTE — Patient Outreach (Addendum)
  Care Coordination   Initial Visit Note   06/05/2022 Name: Khameron Gruenwald MRN: [id number] DOB: 11/19/1960  Rendell Thivierge is a 61 y.o. year old male who sees Olin Hauser, DO for primary care. I spoke with  Yehuda Budd by phone today  What matters to the patients health and wellness today?  Prior auth for [employer]    Goals Addressed             This Visit's Progress    care coordination activities       Care Coordination Interventions:   Needs assessment completed/SDOH screening completed Case Management program offered Patient discussed concern regarding  prior authoriztion for his Monjaro prescription-last dose this week, however prescription is $500.00 Practice pharmacist contacted for assistance and direction, she is agreeable to researching this and patient's provider Update: per pharmacist [facility] had just not re-run the Rx since the PA had been completed. Prescription now going through insurance for $25/month. Pharmacist has informed patient Patient verbalized having no additional nursing or community resource needs at this time        SDOH assessments and interventions completed:  Yes  SDOH Interventions Today    Flowsheet Row Most Recent Value  SDOH Interventions   Food Insecurity Interventions Intervention Not Indicated  Housing Interventions Intervention Not Indicated  Stress Interventions Intervention Not Indicated  Transportation Interventions Intervention Not Indicated        Care Coordination Interventions Activated:  Yes  Care Coordination Interventions:  Yes, provided   Follow up plan: No further intervention required.   Encounter Outcome:  Pt. Visit Completed

## 2022-06-05 NOTE — Patient Instructions (Signed)
Visit Information  Thank you for taking time to visit with me today. Please don't hesitate to contact me if I can be of assistance to you.   Following are the goals we discussed today:   Goals Addressed             This Visit's Progress    care coordination activities       Care Coordination Interventions:   Needs assessment completed/SDOH screening completed Case Management program offered Patient discussed concern regarding  prior authoriztion for his Monjaro prescription-last dose this week, however prescription is $500.00 Practice pharmacist contacted for assistance and direction, she is agreeable to researching this and patient's provider Update: per pharmacist [facility] had just not re-run the Rx since the PA had been completed. Prescription now going through insurance for $25/month. Pharmacist has informed patient         Please call the care guide team at [phone] if you need to cancel or reschedule your appointment.   If you are experiencing a Mental Health or [place] or need someone to talk to, please call the Suicide and Crisis Lifeline: 988   Patient verbalizes understanding of instructions and care plan provided today and agrees to view in [place]. Active MyChart status and patient understanding of how to access instructions and care plan via MyChart confirmed with patient.     No further follow up required: Patient verbalized having no additional nursing or community resource needs  Elliot Gurney, [place] Worker  [facility] Care Management [phone]

## 2022-06-05 NOTE — Chronic Care Management (AMB) (Addendum)
  Chronic Care Management   Outreach Note  06/05/2022 Name: Alec Hurst MRN: [id number] DOB: 04-22-1961  Referred by: Olin Hauser, DO Reason for referral : No chief complaint on file.  Received a call from [employer] advising that per her conversation with patient today, cost of Mounjaro from [employer] remains unaffordable. From review of telephone encounter in chart from 7/26, note prior authorization for this medication completed on 7/27 Place call to [place] on behalf of patient today. Request pharmacy re-run claim for patient. Pensions consultant advises claim now going through [employer] for copayment of $25/month and pharmacy will get [facility] Rx ready for patient. Place call to patient today to let him know.    Follow Up Plan: No further outreach needed at this time  Wallace Cullens, PharmD, [place] Management [phone]

## 2022-07-03 ENCOUNTER — Other Ambulatory Visit: Payer: Self-pay | Admitting: Family Medicine

## 2022-07-03 DIAGNOSIS — E1169 Type 2 diabetes mellitus with other specified complication: Secondary | ICD-10-CM

## 2022-07-04 ENCOUNTER — Encounter: Payer: Self-pay | Admitting: Family Medicine

## 2022-07-04 DIAGNOSIS — E1169 Type 2 diabetes mellitus with other specified complication: Secondary | ICD-10-CM

## 2022-07-04 NOTE — Telephone Encounter (Signed)
Requested medication (s) are due for refill today: yes  Requested medication (s) are on the active medication list: yes  Last refill:  05/13/22  Future visit scheduled: no  Notes to clinic:  Unable to refill per protocol, not assigned to protocol, review manually.     Requested Prescriptions  Pending Prescriptions Disp Refills   MOUNJARO 5 MG/0.5ML Pen [Pharmacy Med Name: MOUNJARO '5MG'$ /0.5ML PF PEN INJ] 2 mL 0    Sig: ADMINISTER '5MG'$  UNDER THE SKIN 1 TIME A WEEK     Off-Protocol Failed - 07/03/2022  8:21 AM      Failed - Medication not assigned to a protocol, review manually.      Passed - Valid encounter within last 12 months    Recent Outpatient Visits           1 month ago Type 2 diabetes mellitus with other specified complication, without long-term current use of insulin [facility])   [facility] Olin Hauser, DO   6 months ago Pre-diabetes   [facility] Olin Hauser, DO   11 months ago Annual physical exam   [place], DO   1 year ago Pre-diabetes   [facility] Olin Hauser, DO   1 year ago [place], [place]ne [place], [place]

## 2022-07-05 MED ORDER — MOUNJARO 7.5 MG/0.5ML ~~LOC~~ SOAJ: 7.5000 mg | SUBCUTANEOUS | 1 refills | Status: DC

## 2022-07-29 ENCOUNTER — Ambulatory Visit (INDEPENDENT_AMBULATORY_CARE_PROVIDER_SITE_OTHER): Payer: BC Managed Care – PPO | Admitting: Family Medicine

## 2022-07-29 ENCOUNTER — Other Ambulatory Visit (HOSPITAL_COMMUNITY)
Admission: RE | Admit: 2022-07-29 | Discharge: 2022-07-29 | Disposition: A | Discharge status: Home / Self Care | Payer: BC Managed Care – PPO | Source: Ambulatory Visit | Attending: Family Medicine | Admitting: Family Medicine

## 2022-07-29 ENCOUNTER — Encounter: Payer: Self-pay | Admitting: Family Medicine

## 2022-07-29 VITALS — BP 130/80 | HR 55 | Ht 71.0 in | Wt 188.6 lb

## 2022-07-29 DIAGNOSIS — F411 Generalized anxiety disorder: Secondary | ICD-10-CM

## 2022-07-29 DIAGNOSIS — E1169 Type 2 diabetes mellitus with other specified complication: Secondary | ICD-10-CM | POA: Diagnosis not present

## 2022-07-29 DIAGNOSIS — I480 Paroxysmal atrial fibrillation: Secondary | ICD-10-CM | POA: Diagnosis not present

## 2022-07-29 DIAGNOSIS — I714 Abdominal aortic aneurysm, without rupture, unspecified: Secondary | ICD-10-CM

## 2022-07-29 DIAGNOSIS — M1000 Idiopathic gout, unspecified site: Secondary | ICD-10-CM

## 2022-07-29 DIAGNOSIS — Z7252 High risk homosexual behavior: Secondary | ICD-10-CM | POA: Diagnosis not present

## 2022-07-29 DIAGNOSIS — E785 Hyperlipidemia, unspecified: Secondary | ICD-10-CM

## 2022-07-29 DIAGNOSIS — E663 Overweight: Secondary | ICD-10-CM

## 2022-07-29 DIAGNOSIS — Z0000 Encounter for general adult medical examination without abnormal findings: Secondary | ICD-10-CM

## 2022-07-29 DIAGNOSIS — I700 Atherosclerosis of aorta: Secondary | ICD-10-CM

## 2022-07-29 DIAGNOSIS — Z114 Encounter for screening for human immunodeficiency virus [HIV]: Secondary | ICD-10-CM

## 2022-07-29 DIAGNOSIS — F410 Panic disorder [episodic paroxysmal anxiety] without agoraphobia: Secondary | ICD-10-CM

## 2022-07-29 DIAGNOSIS — R351 Nocturia: Secondary | ICD-10-CM | POA: Diagnosis not present

## 2022-07-29 DIAGNOSIS — M1009 Idiopathic gout, multiple sites: Secondary | ICD-10-CM

## 2022-07-29 DIAGNOSIS — Z125 Encounter for screening for malignant neoplasm of prostate: Secondary | ICD-10-CM

## 2022-07-29 MED ORDER — COLCHICINE 0.6 MG PO TABS: ORAL_TABLET | ORAL | 3 refills | Status: DC

## 2022-07-29 NOTE — Progress Notes (Unsigned)
Subjective:    Patient ID: Alec Hurst, male    DOB: Apr 28, 1961, 61 y.o.   MRN: [id number]  Alec Hurst is a 61 y.o. male presenting on 07/29/2022 for Annual Exam   HPI  Here for Annual Physical and Lab Orders today  Type 2 Diabetes Overweight BMI >26 Doing well on Mounjaro overall Prior A1c 6.6 to 6.7, trend. Due today for A1c CBGs: Not checking CBG Meds: Mounjaro 7.'5mg'$  weekly, has not started yet but will do soon, was on '5mg'$  weekly OFF Metformin Currently not on ACEi/ARB Lifestyle: Dpwm wt to 188 from 200 lbs - Diet (balanced diet, no particular diet plan) - Exercise (working on improving regular exercise)  HYPERLIPIDEMIA: - Reports no concerns due for repeat lipid Off Statin   High Risk Sexual Behavior / MSM   Generalized Anxiety Disorder (GAD) with panic attacks: - Review chronic history of anxiety and mood disorder, he has been relatively stable on medicine and lifestyle changes with this, but 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  - He takes Propranolol '10mg'$  daily PRN for performance anxiety, otherwise not taking regularly or other meds - Currently doing well   Abdominal Aorta Diltation/Aneurysm Abd [place] Screening Aorta done 08/29/20, next repeat within 5 years Aortic atherosclerosis on image, he remains on medication   GERD Taking Omeprazole PRN   Add Screening panel for STDs today including HIV, RPR, Urine GC Chlamydia  Health Maintenance: Due for Flu shot and COVID Booster, later today at pharmacy  Colon CA Screening: 08/17/15 - [facility] Dr Vira Agar - polypx2 tubular adenoma, int hem, rpt 5 yr   [facility] Dr Bayard Hugger for upcoming scheduling colonoscopy likely in 2023 or 2024     06/05/2022   11:20 AM 12/31/2021    1:32 PM 04/30/2021    9:16 AM  Depression screen PHQ 2/9  Decreased Interest 0 0 0  Down, Depressed, Hopeless 0 0 0  PHQ - 2 Score 0 0 0  Altered sleeping  0 0  Tired, decreased energy  0 2  Change in  appetite  0 0  Feeling bad or failure about yourself   0 0  Trouble concentrating  0 0  Moving slowly or fidgety/restless  0 0  Suicidal thoughts   0  PHQ-9 Score  0 2  Difficult doing work/chores  Not difficult at all     Past Medical History:  Diagnosis Date   Calculus of kidney    Delayed ejaculation    ED (erectile dysfunction)    Hx of colonic polyps    Impotence, organic    Malaise and fatigue    Panic disorder with agoraphobia    Sciatica    Past Surgical History:  Procedure Laterality Date   LITHOTRIPSY     Social History   Socioeconomic History   Marital status: Married    Spouse name: Not on file   Number of children: Not on file   Years of education: Not on file   Highest education level: Not on file  Occupational History   Occupation: Musician Copywriter, advertising, Photographer)  Tobacco Use   Smoking status: Never   Smokeless tobacco: Never  Vaping Use   Vaping Use: Never used  Substance and Sexual Activity   Alcohol use: Yes    Alcohol/week: 0.0 standard drinks of alcohol    Comment: occasional   Drug use: No   Sexual activity: Not on file  Other Topics Concern   Not on  file  Social History Narrative   Not on file   Social Determinants of Health   Financial Resource Strain: Not on file  Food Insecurity: No Food Insecurity (06/05/2022)   Hunger Vital Sign    Worried About Running Out of Food in the Last Year: Never true    Ran Out of Food in the Last Year: Never true  Transportation Needs: No Transportation Needs (06/05/2022)   PRAPARE - Hydrologist (Medical): No    Lack of Transportation (Non-Medical): No  Physical Activity: Not on file  Stress: No Stress Concern Present (06/05/2022)   [place]    Feeling of Stress : Not at all  Social Connections: Not on file  Intimate Partner Violence: Not on file   Family History  Problem Relation Age of Onset    Colon cancer Maternal Aunt    Other Mother        rx med addiction before passing   Heart attack Paternal Uncle 3   Current Outpatient Medications on File Prior to Visit  Medication Sig   ketoconazole (NIZORAL) 2 % shampoo Apply 1 application topically daily as needed for irritation. Up to 1 week as needed for flare   LOTEMAX SM 0.38 % GEL INSTILL 1 DROP INTO BOTH EYES THREE TIMES DAILY FOR 1WEEK   Melatonin 5 MG TABS Take 5 mg by mouth at bedtime as needed.   MOUNJARO 7.5 MG/0.5ML Pen Inject 7.5 mg into the skin once a week.   omeprazole (PRILOSEC) 20 MG capsule TAKE 1 CAPSULE(20 MG) BY MOUTH DAILY BEFORE BREAKFAST   permethrin (ELIMITE) 5 % cream APPLY TO WHOLE BODY, FROM HEAD TO TOE AT BEDTIME, LEAVE ON FOR 8 TO 12 HOURS( OVERNIGHT), WASH OFF NEXT DAY. REPEAT IN 14 DAYS AS NEEDED   propranolol (INDERAL) 10 MG tablet Take 1 tablet (10 mg total) by mouth daily as needed (performance anxiety, panic).   sildenafil (REVATIO) 20 MG tablet Take 20 mg by mouth 3 (three) times daily.   sucralfate (CARAFATE) 1 g tablet TAKE 1 TABLET(1 GRAM) BY MOUTH FOUR TIMES DAILY AT BEDTIME AND WITH MEALS AS NEEDED FOR ACID REFLUX   No current facility-administered medications on file prior to visit.    Review of Systems Per HPI unless specifically indicated above      Objective:    BP 130/80 (BP Location: Left Arm, Patient Position: Sitting, Cuff Size: Normal)   Pulse (!) 55   Ht '5\' 11"'$  (1.803 m)   Wt 188 lb 9.6 oz (85.5 kg)   SpO2 99%   BMI 26.30 kg/m   Wt Readings from Last 3 Encounters:  07/29/22 188 lb 9.6 oz (85.5 kg)  05/13/22 200 lb 12.8 oz (91.1 kg)  12/31/21 202 lb (91.6 kg)    Physical Exam   Results for orders placed or performed in visit on 05/13/22  Urine Microalbumin w/creat. ratio  Result Value Ref Range   Creatinine, Urine 144 20 - 320 mg/dL   Microalb, Ur 1.7 mg/dL   Microalb Creat Ratio 12 <30 mcg/mg creat  POCT HgB A1C  Result Value Ref Range   Hemoglobin A1C 6.7 (A)  4.0 - 5.6 %      Assessment & Plan:   Problem List Items Addressed This Visit     Abdominal aortic aneurysm (AAA) without rupture (HCC)   Generalized anxiety disorder with panic attacks   Gout   Relevant Medications   colchicine 0.6  MG tablet   High risk sexual behavior   Relevant Orders   HIV Antibody (routine testing w rflx)   RPR   GC/Chlamydia probe amp ([place]not at [facility]   Hyperlipidemia associated with type 2 diabetes mellitus ([place])   Nocturia   Paroxysmal atrial fibrillation ([place])   Screening for prostate cancer   Type 2 diabetes mellitus with other specified complication ([place])   Other Visit Diagnoses     Annual physical exam    -  Primary   Screening for HIV (human immunodeficiency virus)       Relevant Orders   HIV Antibody (routine testing w rflx)   Overweight (BMI 25.0-29.9)       Aortic atherosclerosis (HCC)   (Chronic)         Updated Health Maintenance information Fasting labs ordered today STD Panel screening ordered today Encouraged improvement to lifestyle with diet and exercise Goal of weight loss  Flu Shot + COVID Booster today at pharmacy   Meds ordered this encounter  Medications   colchicine 0.6 MG tablet    Sig: START WITH 2 TABLETS IF GOUT FLARE, MAY REPEAT 1 TABLET IN 2 HOURS, THEN CAN TAKE 1 TABLET EVERY DAY FOR UP TO 7 TO 10 DAYS    Dispense:  30 tablet    Refill:  3      Follow up plan: No follow-ups on file.  Nobie Putnam, [facility] 07/29/2022, 1:37 PM

## 2022-07-29 NOTE — Patient Instructions (Addendum)
Thank you for coming to the office today.  Flu Shot and COVID Booster later today  Future reminder to schedule Colonoscopy at [place] Dr Elyse Hsu light to proceed with [facility] 7.'5mg'$  weekly, keep in mind eventually if you need we can further adjust dose to '10mg'$ , or keep it at 7.'5mg'$   Let me know if questions there.  BP repeat is normal, initial reading was false alarm.  Labs ordered today including STD Screening HIV Syphilis Urine test as well.   Please schedule a Follow-up Appointment to: Return in about 6 months (around 01/28/2023) for 6 month follow-up DM A1c.  If you have any other questions or concerns, please feel free to call the office or send a message through [place]. You may also schedule an earlier appointment if necessary.  Additionally, you may be receiving a survey about your experience at our office within a few days to 1 week by e-mail or mail. We value your feedback.  Nobie Putnam, DO [place]

## 2022-07-30 LAB — COMPLETE METABOLIC PANEL WITHOUT GFR
AG Ratio: 2 (calc) (ref 1.0–2.5)
ALT: 31 U/L (ref 9–46)
AST: 24 U/L (ref 10–35)
Albumin: 4.5 g/dL (ref 3.6–5.1)
Alkaline phosphatase (APISO): 51 U/L (ref 35–144)
BUN: 11 mg/dL (ref 7–25)
CO2: 24 mmol/L (ref 20–32)
Calcium: 9.3 mg/dL (ref 8.6–10.3)
Chloride: 107 mmol/L (ref 98–110)
Creat: 0.9 mg/dL (ref 0.70–1.35)
Globulin: 2.3 g/dL (ref 1.9–3.7)
Glucose, Bld: 89 mg/dL (ref 65–99)
Potassium: 4 mmol/L (ref 3.5–5.3)
Sodium: 142 mmol/L (ref 135–146)
Total Bilirubin: 0.9 mg/dL (ref 0.2–1.2)
Total Protein: 6.8 g/dL (ref 6.1–8.1)
eGFR: 98 mL/min/1.73m2

## 2022-07-30 LAB — CBC WITH DIFFERENTIAL/PLATELET
Absolute Monocytes: 672 cells/uL (ref 200–950)
Basophils Absolute: 40 cells/uL (ref 0–200)
Basophils Relative: 0.5 %
Eosinophils Absolute: 136 cells/uL (ref 15–500)
Eosinophils Relative: 1.7 %
HCT: 48.1 % (ref 38.5–50.0)
Hemoglobin: 16.9 g/dL (ref 13.2–17.1)
Lymphs Abs: 2024 cells/uL (ref 850–3900)
MCH: 33.4 pg — ABNORMAL HIGH (ref 27.0–33.0)
MCHC: 35.1 g/dL (ref 32.0–36.0)
MCV: 95.1 fL (ref 80.0–100.0)
MPV: 11.1 fL (ref 7.5–12.5)
Monocytes Relative: 8.4 %
Neutro Abs: 5128 cells/uL (ref 1500–7800)
Neutrophils Relative %: 64.1 %
Platelets: 296 10*3/uL (ref 140–400)
RBC: 5.06 10*6/uL (ref 4.20–5.80)
RDW: 12.2 % (ref 11.0–15.0)
Total Lymphocyte: 25.3 %
WBC: 8 10*3/uL (ref 3.8–10.8)

## 2022-07-30 LAB — LIPID PANEL
Cholesterol: 207 mg/dL — ABNORMAL HIGH (ref <200)
HDL: 44 mg/dL (ref >=40)
LDL Cholesterol (Calc): 138 mg/dL (calc) — ABNORMAL HIGH
Non-HDL Cholesterol (Calc): 163 mg/dL (calc) — ABNORMAL HIGH (ref <130)
Total CHOL/HDL Ratio: 4.7 (calc) (ref <5.0)
Triglycerides: 126 mg/dL (ref <150)

## 2022-07-30 LAB — HEMOGLOBIN A1C
Hgb A1c MFr Bld: 5.9 % of total Hgb — ABNORMAL HIGH (ref <5.7)
Mean Plasma Glucose: 123 mg/dL
eAG (mmol/L): 6.8 mmol/L

## 2022-07-30 LAB — RPR: RPR Ser Ql: NONREACTIVE

## 2022-07-30 LAB — HIV ANTIBODY (ROUTINE TESTING W REFLEX): HIV 1&2 Ab, 4th Generation: NONREACTIVE

## 2022-07-30 LAB — PSA: PSA: 0.67 ng/mL (ref <=4.00)

## 2022-07-30 LAB — URIC ACID: Uric Acid, Serum: 8.7 mg/dL — ABNORMAL HIGH (ref 4.0–8.0)

## 2022-07-31 LAB — GC/CHLAMYDIA PROBE AMP (~~LOC~~) NOT AT ARMC
Chlamydia: NEGATIVE
Comment: NEGATIVE
Comment: NORMAL
Neisseria Gonorrhea: NEGATIVE

## 2022-09-03 DIAGNOSIS — I10 Essential (primary) hypertension: Secondary | ICD-10-CM | POA: Diagnosis not present

## 2022-09-03 DIAGNOSIS — Z8679 Personal history of other diseases of the circulatory system: Secondary | ICD-10-CM | POA: Diagnosis not present

## 2022-09-03 DIAGNOSIS — Z9889 Other specified postprocedural states: Secondary | ICD-10-CM | POA: Diagnosis not present

## 2022-09-03 DIAGNOSIS — I480 Paroxysmal atrial fibrillation: Secondary | ICD-10-CM | POA: Diagnosis not present

## 2022-10-24 ENCOUNTER — Encounter: Payer: Self-pay | Admitting: Family Medicine

## 2022-10-24 DIAGNOSIS — E1169 Type 2 diabetes mellitus with other specified complication: Secondary | ICD-10-CM

## 2022-10-24 MED ORDER — MOUNJARO 7.5 MG/0.5ML ~~LOC~~ SOAJ: 7.5000 mg | SUBCUTANEOUS | 1 refills | Status: DC

## 2022-12-05 ENCOUNTER — Encounter: Payer: Self-pay | Admitting: Family Medicine

## 2022-12-05 DIAGNOSIS — E1169 Type 2 diabetes mellitus with other specified complication: Secondary | ICD-10-CM

## 2022-12-06 MED ORDER — MOUNJARO 7.5 MG/0.5ML ~~LOC~~ SOAJ: 7.5000 mg | SUBCUTANEOUS | 1 refills | Status: DC

## 2022-12-10 ENCOUNTER — Other Ambulatory Visit: Payer: Self-pay | Admitting: Family Medicine

## 2022-12-10 DIAGNOSIS — M1009 Idiopathic gout, multiple sites: Secondary | ICD-10-CM

## 2022-12-30 DIAGNOSIS — K4090 Unilateral inguinal hernia, without obstruction or gangrene, not specified as recurrent: Secondary | ICD-10-CM | POA: Diagnosis not present

## 2023-02-03 ENCOUNTER — Ambulatory Visit (INDEPENDENT_AMBULATORY_CARE_PROVIDER_SITE_OTHER): Payer: BC Managed Care – PPO | Admitting: Family Medicine

## 2023-02-03 ENCOUNTER — Other Ambulatory Visit (HOSPITAL_COMMUNITY)
Admission: RE | Admit: 2023-02-03 | Discharge: 2023-02-03 | Disposition: A | Discharge status: Home / Self Care | Payer: BC Managed Care – PPO | Source: Ambulatory Visit | Attending: Family Medicine | Admitting: Family Medicine

## 2023-02-03 ENCOUNTER — Encounter: Payer: Self-pay | Admitting: Family Medicine

## 2023-02-03 VITALS — BP 127/82 | HR 80 | Ht 71.0 in | Wt 179.0 lb

## 2023-02-03 DIAGNOSIS — E1169 Type 2 diabetes mellitus with other specified complication: Secondary | ICD-10-CM | POA: Diagnosis not present

## 2023-02-03 DIAGNOSIS — Z7252 High risk homosexual behavior: Secondary | ICD-10-CM | POA: Insufficient documentation

## 2023-02-03 LAB — POCT GLYCOSYLATED HEMOGLOBIN (HGB A1C): Hemoglobin A1C: 5.2 % (ref 4.0–5.6)

## 2023-02-03 NOTE — Patient Instructions (Addendum)
Thank you for coming to the office today.  Recent Labs    05/13/22 0939 07/29/22 1355 02/03/23 1333  HGBA1C 6.7* 5.9* 5.2   Excellent results. Keep on Mounjaro 7.5mg  weekly injection as ordered.   DUE for FASTING BLOOD WORK (no food or drink after midnight before the lab appointment, only water or coffee without cream/sugar on the morning of)  SCHEDULE "Lab Only" visit in the morning at the clinic for lab draw in 6 MONTHS   - Make sure Lab Only appointment is at about 1 week before your next appointment, so that results will be available  For Lab Results, once available within 2-3 days of blood draw, you can can log in to MyChart online to view your results and a brief explanation. Also, we can discuss results at next follow-up visit.   Please schedule a Follow-up Appointment to: Return in about 6 months (around 08/05/2023) for 6 month Annual Physical AM apt fasting lab + urine testing AFTER.  If you have any other questions or concerns, please feel free to call the office or send a message through MyChart. You may also schedule an earlier appointment if necessary.  Additionally, you may be receiving a survey about your experience at our office within a few days to 1 week by e-mail or mail. We value your feedback.  Saralyn Pilar, DO [facility], [place]

## 2023-02-03 NOTE — Progress Notes (Signed)
Subjective:    Patient ID: Alec Hurst, male    DOB: April 21, 1961, 62 y.o.   MRN: [id number]  Alec Hurst is a 62 y.o. male presenting on 02/03/2023 for Diabetes   HPI  Type 2 Diabetes BMI >24 Doing well on Mounjaro overall Prior A1c 6.6 to 6.7, trend, last has improved A1c 5.7. Today due for A1c repeat Missed March 2024 for [facility] due to accidentally threw 1 box away. CBGs: Not checking CBG Meds: Mounjaro 7.5mg  weekly OFF Metformin Currently not on ACEi/ARB Lifestyle: Weight down to 170 lbs, previously with stomach virus, then back to 178 range, overall down 10 lbs in 6 months and 20 lbs in about 1 year - Diet (balanced diet, no particular diet plan) - Exercise (working on improving regular exercise)    High Risk Sexual Behavior / MSM Requesting routine urine STD screen gonorrhea chlamydia, he is asymptomatic. No new concerns.   GERD Taking Omeprazole PRN   Add Screening panel for STDs today including Urine GC Chlamydia and he declines HIV RPR blood test   Health Maintenance: Due for Flu shot and COVID Booster, later today at pharmacy   Colon CA Screening: 08/17/15 - [facility] Dr Mechele Collin - polypx2 tubular adenoma, int hem, rpt 5 yr   [facility] Dr Lavona Mound for upcoming scheduling colonoscopy likely in 2024, he is waiting still now due to hernia repair     02/03/2023    1:26 PM 06/05/2022   11:20 AM 12/31/2021    1:32 PM  Depression screen PHQ 2/9  Decreased Interest 0 0 0  Down, Depressed, Hopeless 0 0 0  PHQ - 2 Score 0 0 0  Altered sleeping 1  0  Tired, decreased energy 1  0  Change in appetite 0  0  Feeling bad or failure about yourself  0  0  Trouble concentrating 0  0  Moving slowly or fidgety/restless 0  0  Suicidal thoughts 0    PHQ-9 Score 2  0  Difficult doing work/chores   Not difficult at all    Social History   Tobacco Use   Smoking status: Never   Smokeless tobacco: Never  Vaping Use   Vaping Use: Never used  Substance Use Topics   Alcohol  use: Yes    Alcohol/week: 0.0 standard drinks of alcohol    Comment: occasional   Drug use: No    Review of Systems Per HPI unless specifically indicated above     Objective:    BP 127/82   Pulse 80   Ht 5\' 11"  (1.803 m)   Wt 179 lb (81.2 kg)   BMI 24.97 kg/m   Wt Readings from Last 3 Encounters:  02/03/23 179 lb (81.2 kg)  07/29/22 188 lb 9.6 oz (85.5 kg)  05/13/22 200 lb 12.8 oz (91.1 kg)    Physical Exam Vitals and nursing note reviewed.  Constitutional:      General: He is not in acute distress.    Appearance: Normal appearance. He is well-developed. He is not diaphoretic.     Comments: Well-appearing, comfortable, cooperative  HENT:     Head: Normocephalic and atraumatic.  Eyes:     General:        Right eye: No discharge.        Left eye: No discharge.     Conjunctiva/sclera: Conjunctivae normal.  Cardiovascular:     Rate and Rhythm: Normal rate.  Pulmonary:     Effort: Pulmonary effort is normal.  Skin:  General: Skin is warm and dry.     Findings: No erythema or rash.  Neurological:     Mental Status: He is alert and oriented to person, place, and time.  Psychiatric:        Mood and Affect: Mood normal.        Behavior: Behavior normal.        Thought Content: Thought content normal.     Comments: Well groomed, good eye contact, normal speech and thoughts      Results for orders placed or performed in visit on 02/03/23  POCT HgB A1C  Result Value Ref Range   Hemoglobin A1C 5.2 4.0 - 5.6 %   HbA1c POC (<> result, manual entry)     HbA1c, POC (prediabetic range)     HbA1c, POC (controlled diabetic range)        Assessment & Plan:   Problem List Items Addressed This Visit     High risk sexual behavior   Relevant Orders   GC/Chlamydia probe amp ([place])not at [facility]   Type 2 diabetes mellitus with other specified complication - Primary    Very well controlled A1c 5.2, T2DM Weight loss Concern with HLD Past on Metformin  Plan:   Continue Mounjaro 7.5mg  weekly inj Encourage improved lifestyle - low carb, low sugar diet, reduce portion size, continue improving regular exercise Will consider Statin therapy for ASCVD risk reduction Will consider ACEi/ARB for kidney protection       Relevant Orders   POCT HgB A1C (Completed)    Orders Placed This Encounter  Procedures   POCT HgB A1C    No orders of the defined types were placed in this encounter.    Follow up plan: Return in about 6 months (around 08/05/2023) for 6 month Annual Physical AM apt fasting lab + urine testing AFTER.  Future labs including HIV RPR Urine for STD Screen in addition to routine labs.   Saralyn Pilar, DO [facility] 02/03/2023, 1:33 PM

## 2023-02-03 NOTE — Assessment & Plan Note (Signed)
Very well controlled A1c 5.2, T2DM Weight loss Concern with HLD Past on Metformin  Plan:  Continue Mounjaro 7.5mg  weekly inj Encourage improved lifestyle - low carb, low sugar diet, reduce portion size, continue improving regular exercise Will consider Statin therapy for ASCVD risk reduction Will consider ACEi/ARB for kidney protection

## 2023-02-05 LAB — GC/CHLAMYDIA PROBE AMP (~~LOC~~) NOT AT ARMC
Chlamydia: NEGATIVE
Comment: NEGATIVE
Comment: NORMAL
Neisseria Gonorrhea: NEGATIVE

## 2023-02-11 DIAGNOSIS — N200 Calculus of kidney: Secondary | ICD-10-CM | POA: Diagnosis not present

## 2023-02-17 DIAGNOSIS — N200 Calculus of kidney: Secondary | ICD-10-CM | POA: Diagnosis not present

## 2023-02-17 DIAGNOSIS — N5203 Combined arterial insufficiency and corporo-venous occlusive erectile dysfunction: Secondary | ICD-10-CM | POA: Diagnosis not present

## 2023-03-13 DIAGNOSIS — Z01818 Encounter for other preprocedural examination: Secondary | ICD-10-CM | POA: Diagnosis not present

## 2023-03-19 DIAGNOSIS — I700 Atherosclerosis of aorta: Secondary | ICD-10-CM | POA: Diagnosis not present

## 2023-03-19 DIAGNOSIS — I480 Paroxysmal atrial fibrillation: Secondary | ICD-10-CM | POA: Diagnosis not present

## 2023-03-19 DIAGNOSIS — K4090 Unilateral inguinal hernia, without obstruction or gangrene, not specified as recurrent: Secondary | ICD-10-CM | POA: Diagnosis not present

## 2023-03-19 DIAGNOSIS — E119 Type 2 diabetes mellitus without complications: Secondary | ICD-10-CM | POA: Diagnosis not present

## 2023-03-19 DIAGNOSIS — Z7985 Long-term (current) use of injectable non-insulin antidiabetic drugs: Secondary | ICD-10-CM | POA: Diagnosis not present

## 2023-03-19 DIAGNOSIS — Z7984 Long term (current) use of oral hypoglycemic drugs: Secondary | ICD-10-CM | POA: Diagnosis not present

## 2023-03-19 DIAGNOSIS — I10 Essential (primary) hypertension: Secondary | ICD-10-CM | POA: Diagnosis not present

## 2023-03-19 DIAGNOSIS — I340 Nonrheumatic mitral (valve) insufficiency: Secondary | ICD-10-CM | POA: Diagnosis not present

## 2023-06-18 ENCOUNTER — Ambulatory Visit: Payer: Self-pay

## 2023-06-18 NOTE — Telephone Encounter (Signed)
Summary: Tingling sensation in left hand   Patient experiencing tingling in left hand for 2 months.  No appointment available until 9/10 patient seeking clinical advice and does not want to wait until 9/10.     Chief Complaint: Tingling to left hand, comes and goes. Symptoms: Above Frequency: 2 months ago Pertinent Negatives: Patient denies  Disposition: [] ED /[] Urgent Care (no appt availability in office) / [x] Appointment(In office/virtual)/ []  [place] [facility]/ [] Home Care/ [] Refused Recommended Disposition /[] [place] [facility]/ []  Follow-up with PCP Additional Notes: Pt. Agrees with appointment due to work schedule. Asking if he can have labs for his physical that day, especially A1c. Please advise pt.  Reason for Disposition  [1] Numbness or tingling in one or both hands AND [2] is a chronic symptom (recurrent or ongoing AND present > 4 weeks)  Answer Assessment - Initial Assessment Questions 1. SYMPTOM: "What is the main symptom you are concerned about?" (e.g., weakness, numbness)     Tingling left hand, third finger and over and the palm 2. ONSET: "When did this start?" (minutes, hours, days; while sleeping)     May 3. LAST NORMAL: "When was the last time you (the patient) were normal (no symptoms)?"     May 4. PATTERN "Does this come and go, or has it been constant since it started?"  "Is it present now?"     Comes and goes 5. CARDIAC SYMPTOMS: "Have you had any of the following symptoms: chest pain, difficulty breathing, palpitations?"     No 6. NEUROLOGIC SYMPTOMS: "Have you had any of the following symptoms: headache, dizziness, vision loss, double vision, changes in speech, unsteady on your feet?"     No 7. OTHER SYMPTOMS: "Do you have any other symptoms?"     No 8. PREGNANCY: "Is there any chance you are pregnant?" "When was your last menstrual period?"     N/A  Protocols used: Neurologic Deficit-A-AH

## 2023-06-18 NOTE — Telephone Encounter (Signed)
I attempted to call patient to clarify how he plans to handle the labs.  Can you try contacting him back later to clarify the lab request?   It looks like he is scheduled for a visit with me 07/07/23 AM for tingling, so this seems to be a problem focused visit.  He is then scheduled for Annual Physical 07/21/23 at 820am.  He is asking for blood work. Typically that would be associated with the CPE visit 9/30.  It can be drawn 1 week prior to that apt or that morning on 9/30.  Can you clarify - is he asking to draw blood after 07/07/23 apt? That is fine with me, it will end up being about 2 weeks before his physical, but that should be fine.  I can place the orders and have them ready, he would just need a LAB ONLY apt so he can reserve a time to draw blood on 07/07/23 if he wants to do blood draw BEFORE the apt on 9/16.  If he is okay to draw blood AFTER 9/16, he does not need apt, we can talk about it and order his labs and hand him the orders and he can go down the hall to the lab.  Thank you  Saralyn Pilar, DO [facility] 06/18/2023, 3:12 PM

## 2023-06-20 ENCOUNTER — Other Ambulatory Visit: Payer: Self-pay | Admitting: Family Medicine

## 2023-06-20 DIAGNOSIS — R351 Nocturia: Secondary | ICD-10-CM

## 2023-06-20 DIAGNOSIS — Z125 Encounter for screening for malignant neoplasm of prostate: Secondary | ICD-10-CM

## 2023-06-20 DIAGNOSIS — Z7252 High risk homosexual behavior: Secondary | ICD-10-CM

## 2023-06-20 DIAGNOSIS — M1000 Idiopathic gout, unspecified site: Secondary | ICD-10-CM

## 2023-06-20 DIAGNOSIS — Z0000 Encounter for general adult medical examination without abnormal findings: Secondary | ICD-10-CM

## 2023-06-20 DIAGNOSIS — E1169 Type 2 diabetes mellitus with other specified complication: Secondary | ICD-10-CM

## 2023-06-20 DIAGNOSIS — I480 Paroxysmal atrial fibrillation: Secondary | ICD-10-CM

## 2023-06-20 NOTE — Telephone Encounter (Signed)
Orders signed.

## 2023-06-20 NOTE — Telephone Encounter (Signed)
Yes, patient would like to get physical labs at appointment on 8/16.

## 2023-07-07 ENCOUNTER — Encounter: Payer: Self-pay | Admitting: Family Medicine

## 2023-07-07 ENCOUNTER — Ambulatory Visit (INDEPENDENT_AMBULATORY_CARE_PROVIDER_SITE_OTHER): Payer: BC Managed Care – PPO | Admitting: Family Medicine

## 2023-07-07 ENCOUNTER — Other Ambulatory Visit (HOSPITAL_COMMUNITY)
Admission: RE | Admit: 2023-07-07 | Discharge: 2023-07-07 | Disposition: A | Discharge status: Home / Self Care | Payer: BC Managed Care – PPO | Source: Ambulatory Visit | Attending: Family Medicine | Admitting: Family Medicine

## 2023-07-07 VITALS — BP 140/86 | HR 59 | Ht 71.0 in | Wt 187.0 lb

## 2023-07-07 DIAGNOSIS — Z7252 High risk homosexual behavior: Secondary | ICD-10-CM

## 2023-07-07 DIAGNOSIS — I480 Paroxysmal atrial fibrillation: Secondary | ICD-10-CM | POA: Diagnosis not present

## 2023-07-07 DIAGNOSIS — G5622 Lesion of ulnar nerve, left upper limb: Secondary | ICD-10-CM

## 2023-07-07 DIAGNOSIS — Z114 Encounter for screening for human immunodeficiency virus [HIV]: Secondary | ICD-10-CM

## 2023-07-07 DIAGNOSIS — Z23 Encounter for immunization: Secondary | ICD-10-CM | POA: Diagnosis not present

## 2023-07-07 DIAGNOSIS — Z1211 Encounter for screening for malignant neoplasm of colon: Secondary | ICD-10-CM

## 2023-07-07 DIAGNOSIS — R351 Nocturia: Secondary | ICD-10-CM | POA: Diagnosis not present

## 2023-07-07 DIAGNOSIS — Z125 Encounter for screening for malignant neoplasm of prostate: Secondary | ICD-10-CM

## 2023-07-07 DIAGNOSIS — Z0000 Encounter for general adult medical examination without abnormal findings: Secondary | ICD-10-CM

## 2023-07-07 DIAGNOSIS — E785 Hyperlipidemia, unspecified: Secondary | ICD-10-CM

## 2023-07-07 DIAGNOSIS — F330 Major depressive disorder, recurrent, mild: Secondary | ICD-10-CM | POA: Insufficient documentation

## 2023-07-07 DIAGNOSIS — M1000 Idiopathic gout, unspecified site: Secondary | ICD-10-CM

## 2023-07-07 DIAGNOSIS — E1169 Type 2 diabetes mellitus with other specified complication: Secondary | ICD-10-CM

## 2023-07-07 NOTE — Patient Instructions (Addendum)
Thank you for coming to the office today.  Referral to   Dr Lonzo Cloud [facility].   ---------------------------------------------  Recommend towel at night around elbow to off load and avoid pinching the Ulnar Nerve  Also can use wrist brace to do the same during the day and or night.   Cubital Tunnel Syndrome  Cubital tunnel syndrome is a condition that causes pain, numbness, tingling, and weakness of the forearm and hand. It happens when your ulnar nerve is irritated or pinched (compressed). The ulnar nerve runs from your shoulder to the small finger (pinkie) of your hand. In most cases, cubital tunnel syndrome is caused by arm motions that are done over and over during sports or while at work. What are the causes? This condition may be caused by: Pressure on the ulnar nerve. This may be from: Repeated elbow bending. Poorly healed broken bones (fractures). Tumors in the elbow. In most cases, these are not cancer. Scar tissue that forms in the elbow after an injury. Bony growths (spurs) near the ulnar nerve. Stretching of the nerve. This may happen when the tissues that connect bones to each other (ligaments) become loose. Trauma to the nerve at the elbow. What increases the risk? You may be more likely to get this condition if: You do manual labor and have to bend your elbow a lot. You play sports that include a lot of throwing motions, such as baseball. You play contact sports, such as football or lacrosse. You do not warm up enough before you do activities. You have diabetes. You have hypothyroidism. This is when your thyroid does not make enough hormones. What are the signs or symptoms? Symptoms of this condition include: Clumsiness or weakness of the hand. You also may not be able to grip or pinch firmly. Aching, soreness, or tenderness of the inner elbow, forearm, or fingers. You may feel this most in your pinkie or ring finger. More pain when  you force your elbow to bend or shooting pain from your elbow to your hand. Reduced control when you throw objects. Tingling, numbness, or a burning feeling in your forearm, hand, or fingers. You may feel this most in your pinkie or ring finger. How is this diagnosed? This condition is diagnosed based on your symptoms, your medical history, and a physical exam. Your health care provider will ask about any injuries. You may also have tests, such as: An electromyogram (EMG). This is done to see how well your nerves are working. A nerve conduction study (NCS). This is done to see how electrical signals pass through your nerves. Imaging tests, such as X-rays, ultrasound, and MRI. These are done to find the source of your nerve problems. How is this treated? This condition may be treated by: Stopping the activities that make your symptoms worse. Icing your elbow. Taking medicines to reduce pain and swelling. Wearing a removable brace. This stops your elbow from bending. Wearing an elbow pad where the ulnar nerve is closest to the skin. Working with a physical therapist. This may help your symptoms get better. It can also help you improve the strength and range of motion of your elbow, forearm, and hand. Steroids. These are medicines that are injected into your body to reduce inflammation. If these treatments do not help, you may need surgery. Follow these instructions at home: Medicines Take over-the-counter and prescription medicines only as told by your provider. Ask your provider if the medicine prescribed to you requires you to avoid  driving or using machinery. If you have a removable brace: Wear the brace as told by your provider. Remove it only as told by your provider. Check the skin around the brace every day. Tell your provider about any concerns. Loosen the brace if your fingers tingle, become numb, or turn cold and blue. Keep the brace clean. If the brace is not waterproof: Do not  let it get wet. Cover it with a watertight covering when you take a bath or shower. Managing pain, stiffness, and swelling  If told, put ice on the injured area. If you have a removable brace, remove it as told by your provider. Put ice in a plastic bag. Place a towel between your skin and the bag. Leave the ice on for 20 minutes, 2-3 times a day. If your skin turns bright red, remove the ice right away to prevent skin damage. The risk of damage is higher if you cannot feel pain, heat, or cold. Move your fingers often to reduce stiffness and swelling. Raise (elevate) the injured area above the level of your heart while you are sitting or lying down. General instructions Do exercises or physical therapy as told by your provider. If you were given an elbow pad or brace, wear it as told by your provider. Keep all follow-up visits. Your provider will check if your symptoms are getting better. They can also adjust the treatment if needed. Contact a health care provider if: Your symptoms get worse. Your symptoms do not get better with treatment. You have new pain. Your hand on the injured side feels numb or cold. This information is not intended to replace advice given to you by your health care provider. Make sure you discuss any questions you have with your health care provider. Document Revised: 07/26/2022 Document Reviewed: 07/26/2022 Elsevier Patient Education  2024 [employer] Inc.   Please schedule a Follow-up Appointment to: Return if symptoms worsen or fail to improve, for keep apt as scheduled 9/30.  If you have any other questions or concerns, please feel free to call the office or send a message through MyChart. You may also schedule an earlier appointment if necessary.  Additionally, you may be receiving a survey about your experience at our office within a few days to 1 week by e-mail or mail. We value your feedback.  Saralyn Pilar, DO [facility],  [place]

## 2023-07-07 NOTE — Progress Notes (Signed)
Subjective:    Patient ID: Alec Hurst, male    DOB: 04-22-1961, 62 y.o.   MRN: [id number]  Alec Hurst is a 62 y.o. male presenting on 07/07/2023 for Arm Problem (Tingling in Left Arm began about 3 months ago)   HPI  He will get labs today for physical in 2 weeks  Type 2 Diabetes BMI >26 Previously Doing well on Mounjaro overall A1c previously in 5 range. Off of Mounjaro for about 3 months Weight gain 15 lbs Appetite increased off medication CBGs: Not checking CBG OFF Metformin Lifestyle: - Diet (balanced diet, no particular diet plan) - Exercise (working on improving regular exercise)  Ulnar Nerve Entrapment / Tingling, Left arm / Hand Reports symptoms started in May 2024. He describes left hand constant tingling numbness, mostly lateral ulnar edge of palm and fingers 4th and 5th. He has seen chiropractor and continues to work on this for nerve symptoms. 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 He feels flares of depression and emotional lability He saw counselor last week with some benefit. Not ready for medication. Admits "mid life crisis" multiple issues affecting him. Previously took SSRI Escitalopram 5-10mg  previously 2017 and earlier, temporary. But does have some sexual side effects.       07/07/2023   10:26 AM 02/03/2023    1:26 PM 06/05/2022   11:20 AM  Depression screen PHQ 2/9  Decreased Interest 1 0 0  Down, Depressed, Hopeless 1 0 0  PHQ - 2 Score 2 0 0  Altered sleeping 1 1   Tired, decreased energy 1 1   Change in appetite 0 0   Feeling bad or failure about yourself  0 0   Trouble concentrating 1 0   Moving slowly or fidgety/restless 0 0   Suicidal thoughts 0 0   PHQ-9 Score 5 2   Difficult doing work/chores Somewhat difficult      Social History   Tobacco  Use   Smoking status: Never   Smokeless tobacco: Never  Vaping Use   Vaping status: Never Used  Substance Use Topics   Alcohol use: Yes    Alcohol/week: 0.0 standard drinks of alcohol    Comment: occasional   Drug use: No    Review of Systems Per HPI unless specifically indicated above     Objective:    BP (!) 140/86   Pulse (!) 59   Ht 5\' 11"  (1.803 m)   Wt 187 lb (84.8 kg)   SpO2 98%   BMI 26.08 kg/m   Wt Readings from Last 3 Encounters:  07/07/23 187 lb (84.8 kg)  02/03/23 179 lb (81.2 kg)  07/29/22 188 lb 9.6 oz (85.5 kg)    Physical Exam Vitals and nursing note reviewed.  Constitutional:      General: He is not in acute distress.    Appearance: Normal appearance. He is well-developed. He is not diaphoretic.     Comments: Well-appearing, comfortable, cooperative  HENT:     Head: Normocephalic and atraumatic.  Eyes:     General:        Right eye: No discharge.        Left eye: No discharge.     Conjunctiva/sclera: Conjunctivae normal.  Cardiovascular:     Rate and Rhythm: Normal rate.  Pulmonary:  Effort: Pulmonary effort is normal.  Skin:    General: Skin is warm and dry.     Findings: No erythema or rash.  Neurological:     Mental Status: He is alert and oriented to person, place, and time.  Psychiatric:        Behavior: Behavior normal.        Thought Content: Thought content normal.     Comments: Well groomed, good eye contact, normal speech and thoughts. Appears slightly depressed today with some emotional lability.    Results for orders placed or performed in visit on 02/03/23  POCT HgB A1C  Result Value Ref Range   Hemoglobin A1C 5.2 4.0 - 5.6 %   HbA1c POC (<> result, manual entry)     HbA1c, POC (prediabetic range)     HbA1c, POC (controlled diabetic range)    GC/Chlamydia probe amp ([place])not at [facility]  Result Value Ref Range   Neisseria Gonorrhea Negative    Chlamydia Negative    Comment Normal Reference Ranger Chlamydia -  Negative    Comment      Normal Reference Range Neisseria Gonorrhea - Negative      Assessment & Plan:   Problem List Items Addressed This Visit     Gout   High risk sexual behavior   Relevant Orders   C. trachomatis/N. gonorrhoeae RNA   HIV Antibody (routine testing w rflx)   RPR   Hyperlipidemia associated with type 2 diabetes mellitus (HCC)   Nocturia   Paroxysmal atrial fibrillation (HCC)   Screening for prostate cancer   Type 2 diabetes mellitus with other specified complication (HCC)   Other Visit Diagnoses     Entrapment of left ulnar nerve    -  Primary   Need for influenza vaccination       Relevant Orders   Flu vaccine trivalent PF, 6mos and older(Flulaval,Afluria,Fluarix,Fluzone)   Screening for HIV (human immunodeficiency virus)       Relevant Orders   HIV Antibody (routine testing w rflx)   Annual physical exam       Screening for colon cancer       Relevant Orders   Ambulatory referral to Gastroenterology       Assessment and Plan    Ulnar Nerve Entrapment Persistent tingling in the left hand, predominantly affecting the ring and little finger. Not associated with carpal tunnel syndrome. Currently undergoing chiropractic treatment and massage therapy. -Continue with chiropractic treatment and massage therapy. -Consider use of a towel wrap around the elbow during sleep and a wrist brace during the day to avoid flexion and pressure on the ulnar nerve. -If no improvement in symptoms, consider referral to a neurologist for further evaluation and possible nerve conduction study.  Type 2 Diabetes Weight BMI 26 overweight range. Doing well off Mounjaro however issue of appetite increased, impacting him. A1c previously well controlled Labs and followup in 2 weeks as scheduled, we can re-assess A1c and weight trend and consider restart therapy  Depression, major recurrent mild Flare up related to multiple stressors Currently seeking counseling and  considering medication. -Continue with counseling. -Consider restarting Lexapro if symptoms persist or worsen. (Previously on SSRI Lexapro years ago 2017)  General Health Maintenance -Order blood and urine tests for upcoming physical. Including STD Screening panel, high risk patient. -Administer flu shot today. -Consider colonoscopy, last performed in 2016.   Order referral for [facility] as requested.     No orders of the defined types were  placed in this encounter.   Orders Placed This Encounter  Procedures   C. trachomatis/N. gonorrhoeae RNA   Flu vaccine trivalent PF, 6mos and older(Flulaval,Afluria,Fluarix,Fluzone)   HIV Antibody (routine testing w rflx)   RPR   Ambulatory referral to Gastroenterology    Referral Priority:   Routine    Referral Type:   Consultation    Referral Reason:   Specialty Services Required    Number of Visits Requested:   1     Follow up plan: Return if symptoms worsen or fail to improve, for keep apt as scheduled 9/30.   Saralyn Pilar, DO [facility] 07/07/2023, 10:10 AM

## 2023-07-07 NOTE — Addendum Note (Signed)
Addended by: Kavin Leech E on: 07/07/2023 03:38 PM   Modules accepted: Orders

## 2023-07-08 LAB — LIPID PANEL
Cholesterol: 213 mg/dL — ABNORMAL HIGH (ref <200)
HDL: 55 mg/dL (ref >=40)
LDL Cholesterol (Calc): 133 mg/dL — ABNORMAL HIGH
Non-HDL Cholesterol (Calc): 158 mg/dL — ABNORMAL HIGH (ref <130)
Total CHOL/HDL Ratio: 3.9 (calc) (ref <5.0)
Triglycerides: 137 mg/dL (ref <150)

## 2023-07-08 LAB — COMPLETE METABOLIC PANEL WITH GFR
AG Ratio: 2 (calc) (ref 1.0–2.5)
ALT: 21 U/L (ref 9–46)
AST: 17 U/L (ref 10–35)
Albumin: 4.5 g/dL (ref 3.6–5.1)
Alkaline phosphatase (APISO): 48 U/L (ref 35–144)
BUN: 13 mg/dL (ref 7–25)
CO2: 27 mmol/L (ref 20–32)
Calcium: 9.7 mg/dL (ref 8.6–10.3)
Chloride: 105 mmol/L (ref 98–110)
Creat: 0.86 mg/dL (ref 0.70–1.35)
Globulin: 2.3 g/dL (ref 1.9–3.7)
Glucose, Bld: 126 mg/dL — ABNORMAL HIGH (ref 65–99)
Potassium: 4.5 mmol/L (ref 3.5–5.3)
Sodium: 141 mmol/L (ref 135–146)
Total Bilirubin: 0.9 mg/dL (ref 0.2–1.2)
Total Protein: 6.8 g/dL (ref 6.1–8.1)
eGFR: 99 mL/min/{1.73_m2} (ref >=60)

## 2023-07-08 LAB — PSA: PSA: 0.63 ng/mL (ref <=4.00)

## 2023-07-08 LAB — HEMOGLOBIN A1C
Hgb A1c MFr Bld: 6.1 %{Hb} — ABNORMAL HIGH (ref <5.7)
Mean Plasma Glucose: 128 mg/dL
eAG (mmol/L): 7.1 mmol/L

## 2023-07-08 LAB — CBC WITH DIFFERENTIAL/PLATELET
Absolute Monocytes: 683 {cells}/uL (ref 200–950)
Basophils Absolute: 52 {cells}/uL (ref 0–200)
Basophils Relative: 0.8 %
Eosinophils Absolute: 72 cells/uL (ref 15–500)
Eosinophils Relative: 1.1 %
HCT: 50.1 % — ABNORMAL HIGH (ref 38.5–50.0)
Hemoglobin: 17.1 g/dL (ref 13.2–17.1)
Lymphs Abs: 1619 {cells}/uL (ref 850–3900)
MCH: 33.1 pg — ABNORMAL HIGH (ref 27.0–33.0)
MCHC: 34.1 g/dL (ref 32.0–36.0)
MCV: 97.1 fL (ref 80.0–100.0)
MPV: 10.8 fL (ref 7.5–12.5)
Monocytes Relative: 10.5 %
Neutro Abs: 4076 {cells}/uL (ref 1500–7800)
Neutrophils Relative %: 62.7 %
Platelets: 284 10*3/uL (ref 140–400)
RBC: 5.16 10*6/uL (ref 4.20–5.80)
RDW: 12.3 % (ref 11.0–15.0)
Total Lymphocyte: 24.9 %
WBC: 6.5 10*3/uL (ref 3.8–10.8)

## 2023-07-08 LAB — MICROALBUMIN / CREATININE URINE RATIO
Creatinine, Urine: 104 mg/dL (ref 20–320)
Microalb Creat Ratio: 11 mg/g{creat} (ref <30)
Microalb, Ur: 1.1 mg/dL

## 2023-07-08 LAB — HIV ANTIBODY (ROUTINE TESTING W REFLEX): HIV 1&2 Ab, 4th Generation: NONREACTIVE

## 2023-07-08 LAB — RPR: RPR Ser Ql: NONREACTIVE

## 2023-07-08 LAB — URIC ACID: Uric Acid, Serum: 8.7 mg/dL — ABNORMAL HIGH (ref 4.0–8.0)

## 2023-07-15 DIAGNOSIS — L814 Other melanin hyperpigmentation: Secondary | ICD-10-CM | POA: Diagnosis not present

## 2023-07-15 DIAGNOSIS — D1801 Hemangioma of skin and subcutaneous tissue: Secondary | ICD-10-CM | POA: Diagnosis not present

## 2023-07-15 DIAGNOSIS — L820 Inflamed seborrheic keratosis: Secondary | ICD-10-CM | POA: Diagnosis not present

## 2023-07-15 DIAGNOSIS — L821 Other seborrheic keratosis: Secondary | ICD-10-CM | POA: Diagnosis not present

## 2023-07-15 DIAGNOSIS — L578 Other skin changes due to chronic exposure to nonionizing radiation: Secondary | ICD-10-CM | POA: Diagnosis not present

## 2023-07-21 ENCOUNTER — Encounter: Payer: BC Managed Care – PPO | Admitting: Family Medicine

## 2023-07-21 ENCOUNTER — Encounter: Payer: Self-pay | Admitting: Family Medicine

## 2023-07-21 ENCOUNTER — Ambulatory Visit (INDEPENDENT_AMBULATORY_CARE_PROVIDER_SITE_OTHER): Payer: BC Managed Care – PPO | Admitting: Family Medicine

## 2023-07-21 VITALS — BP 132/84 | HR 72 | Ht 69.5 in | Wt 181.0 lb

## 2023-07-21 DIAGNOSIS — H04129 Dry eye syndrome of unspecified lacrimal gland: Secondary | ICD-10-CM | POA: Diagnosis not present

## 2023-07-21 DIAGNOSIS — G5622 Lesion of ulnar nerve, left upper limb: Secondary | ICD-10-CM

## 2023-07-21 DIAGNOSIS — Z0000 Encounter for general adult medical examination without abnormal findings: Secondary | ICD-10-CM | POA: Diagnosis not present

## 2023-07-21 DIAGNOSIS — E1169 Type 2 diabetes mellitus with other specified complication: Secondary | ICD-10-CM

## 2023-07-21 MED ORDER — MOUNJARO 7.5 MG/0.5ML ~~LOC~~ SOAJ
7.5000 mg | SUBCUTANEOUS | 1 refills | Status: DC
Start: 2023-07-21 — End: 2023-11-24

## 2023-07-21 MED ORDER — LOTEMAX SM 0.38 % OP GEL
OPHTHALMIC | 2 refills | Status: DC
Start: 2023-07-21 — End: 2023-07-30

## 2023-07-21 MED ORDER — BACLOFEN 10 MG PO TABS
5.0000 mg | ORAL_TABLET | Freq: Three times a day (TID) | ORAL | 1 refills | Status: DC | PRN
Start: 2023-07-21 — End: 2024-07-05

## 2023-07-21 NOTE — Patient Instructions (Addendum)
Thank you for coming to the office today.  Recent Labs    07/29/22 1355 02/03/23 1333 07/07/23 1049  HGBA1C 5.9* 5.2 6.1*   Restart Mounjaro 7.5mg  weekly inj  We discussed goal to lower cholesterol and consider Rx Statin therapy or newer injectable Cholesterol medicine, such as Repatha.  Try the Baclofen as needed for the nerve entrapment symptoms. Keep up with chiropractor  Contact in a month if need we can consider Neurology testing. ------------------------------------  If we refer for a Coronary Calcium Score Cardiac CT Scan. This is a screening test for patients aged 45-50+ with cardiovascular risk factors or who are healthy but would be interested in Cardiovascular Screening for heart disease. Even if there is a family history of heart disease, this imaging can be useful. Typically it can be done every 5+ years or at a different timeline we agree on  The scan will look at the chest and mainly focus on the heart and identify early signs of calcium build up or blockages within the heart arteries. It is not 100% accurate for identifying blockages or heart disease, but it is useful to help [place] predict who may have some early changes or be at risk in the future for a heart attack or cardiovascular problem.  The results are reviewed by a Cardiologist and they will document the results. It should become available on MyChart. Typically the results are divided into percentiles based on other patients of the same demographic and age. So it will compare your risk to others similar to you. If you have a higher score >99 or higher percentile >75%tile, it is recommended to consider Statin cholesterol therapy and or referral to Cardiologist. I will try to help explain your results and if we have questions we can contact the Cardiologist.  If ordered you will be contacted for scheduling. Usually it is done at any imaging facility through [facility], [facility] or [facility].  The cost is $99 flat fee total and it does not go through insurance, so no authorization is required.     Please schedule a Follow-up Appointment to: Return in about 6 months (around 01/18/2024) for 6 month follow-up DM A1c.  If you have any other questions or concerns, please feel free to call the office or send a message through MyChart. You may also schedule an earlier appointment if necessary.  Additionally, you may be receiving a survey about your experience at our office within a few days to 1 week by e-mail or mail. We value your feedback.  Saralyn Pilar, DO [facility], [place]

## 2023-07-21 NOTE — Progress Notes (Signed)
Subjective:    Patient ID: Alec Hurst, male    DOB: [birth year], 62 y.o.   MRN: [id number]  Alec Hurst is a 62 y.o. male presenting on 07/21/2023 for Annual Exam   HPI  Discussed the use of AI scribe software for clinical note transcription with the patient, who gave verbal consent to proceed.  History of Present Illness    Here for Annual Physical and Lab Review  Type 2 Diabetes BMI >26 Previously Doing well on Mounjaro 7.5mg  overall A1c up to 6.1 off medicine now for months Ready to restart med. Wt gain. Appetite increased off medication CBGs: Not checking CBG OFF Metformin Lifestyle: - Diet (balanced diet, no particular diet plan) - Exercise (working on improving regular exercise)  HYPERLIPIDEMIA: - Reports no concerns. Last lipid panel 2024, LDL 130s, overall improved and stable Not on Statin therapy   Ulnar Nerve Entrapment / Tingling, Left arm / Hand Reports symptoms started in May 2024. He describes left hand constant tingling numbness, mostly lateral ulnar edge of palm and fingers 4th and 5th. He has seen chiropractor and continues to work on this for nerve symptoms. 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 Interested to resume baclofen therapy   Depression, major recurrent Improved mood Still persistent issue. Not ready for medication. Admits "mid life crisis" multiple issues affecting him. Previously took SSRI Escitalopram 5-10mg  previously 2017 and earlier, temporary. But does have some sexual side effects. Not on med  Gout Chronic flares, episodic Recently did have one flare while traveling, improved if takes Colchicine early to prevent flare. Last Uric Acid 8.7    Health Maintenance:  PSA 0.63 (2024)  negative, stable over past 7 years  Upcoming Colonoscopy scheduled November 2024, location in [place].     07/21/2023    8:47 AM 07/07/2023   10:26 AM 02/03/2023    1:26 PM  Depression screen PHQ 2/9  Decreased Interest 1 1 0  Down, Depressed, Hopeless 1 1 0  PHQ - 2 Score 2 2 0  Altered sleeping 0 1 1  Tired, decreased energy 1 1 1   Change in appetite 0 0 0  Feeling bad or failure about yourself  1 0 0  Trouble concentrating 0 1 0  Moving slowly or fidgety/restless 0 0 0  Suicidal thoughts 0 0 0  PHQ-9 Score 4 5 2   Difficult doing work/chores Somewhat difficult Somewhat difficult       07/21/2023    8:47 AM 07/07/2023   10:27 AM 02/03/2023    1:26 PM 12/31/2021    1:32 PM  GAD 7 : Generalized Anxiety Score  Nervous, Anxious, on Edge 1 1 1 1   Control/stop worrying 0 0 1 1  Worry too much - different things 1 0 1 1  Trouble relaxing 0 0 1 1  Restless 0 0 0 0  Easily annoyed or irritable 1 1 1 1   Afraid - awful might happen 0 0 0 0  Total GAD 7 Score 3 2 5 5   Anxiety Difficulty    Somewhat difficult      Past Medical History:  Diagnosis Date   Calculus of kidney    Delayed ejaculation    ED (erectile dysfunction)    Hx of colonic polyps  Impotence, organic    Malaise and fatigue    Panic disorder with agoraphobia    Sciatica    Past Surgical History:  Procedure Laterality Date   LITHOTRIPSY     Social History   Socioeconomic History   Marital status: Married    Spouse name: Not on file   Number of children: Not on file   Years of education: Not on file   Highest education level: Doctorate  Occupational History   Occupation: Musician Biomedical scientist, Geophysicist/field seismologist)  Tobacco Use   Smoking status: Never   Smokeless tobacco: Never  Vaping Use   Vaping status: Never Used  Substance and Sexual Activity   Alcohol use: Yes    Alcohol/week: 0.0 standard drinks of alcohol    Comment: occasional   Drug use: No   Sexual activity: Not on file  Other Topics Concern    Not on file  Social History Narrative   Not on file   Social Determinants of Health   Financial Resource Strain: Low Risk  (02/02/2023)   Overall Financial Resource Strain (CARDIA)    Difficulty of Paying Living Expenses: Not very hard  Food Insecurity: No Food Insecurity (02/02/2023)   Hunger Vital Sign    Worried About Running Out of Food in the Last Year: Never true    Ran Out of Food in the Last Year: Never true  Transportation Needs: No Transportation Needs (02/02/2023)   PRAPARE - Administrator, Civil Service (Medical): No    Lack of Transportation (Non-Medical): No  Physical Activity: Insufficiently Active (02/02/2023)   Exercise Vital Sign    Days of Exercise per Week: 1 day    Minutes of Exercise per Session: 10 min  Stress: Stress Concern Present (02/02/2023)   [employer] of Occupational Health - Occupational Stress Questionnaire    Feeling of Stress : To some extent  Social Connections: Socially Integrated (02/02/2023)   Social Connection and Isolation Panel [NHANES]    Frequency of Communication with Friends and Family: More than three times a week    Frequency of Social Gatherings with Friends and Family: More than three times a week    Attends Religious Services: More than 4 times per year    Active Member of [employer] or Organizations: Yes    Attends Engineer, structural: More than 4 times per year    Marital Status: Married  Catering manager Violence: Not on file   Family History  Problem Relation Age of Onset   Colon cancer Maternal Aunt    Other Mother        rx med addiction before passing   Heart attack Paternal Uncle 23   Current Outpatient Medications on File Prior to Visit  Medication Sig   colchicine 0.6 MG tablet TAKE 2 TABLETS BY MOUTH IF GOUT FLARE, MAY REPEAT 1 TABLET IN 2 HOURS, THEN 1 TABLET EVERY DAY FOR UP TO 7 TO 10 DAYS   ketoconazole (NIZORAL) 2 % shampoo Apply 1 application topically daily as needed for irritation. Up  to 1 week as needed for flare   Melatonin 5 MG TABS Take 5 mg by mouth at bedtime as needed.   propranolol (INDERAL) 10 MG tablet Take 1 tablet (10 mg total) by mouth daily as needed (performance anxiety, panic).   sildenafil (REVATIO) 20 MG tablet Take 20 mg by mouth 3 (three) times daily.   sucralfate (CARAFATE) 1 g tablet TAKE 1 TABLET(1 GRAM) BY MOUTH FOUR TIMES DAILY AT BEDTIME  AND WITH MEALS AS NEEDED FOR ACID REFLUX   No current facility-administered medications on file prior to visit.    Review of Systems  Constitutional:  Negative for activity change, appetite change, chills, diaphoresis, fatigue and fever.  HENT:  Negative for congestion and hearing loss.   Eyes:  Negative for visual disturbance.  Respiratory:  Negative for cough, chest tightness, shortness of breath and wheezing.   Cardiovascular:  Negative for chest pain, palpitations and leg swelling.  Gastrointestinal:  Negative for abdominal pain, constipation, diarrhea, nausea and vomiting.  Genitourinary:  Negative for dysuria, frequency and hematuria.  Musculoskeletal:  Negative for arthralgias and neck pain.  Skin:  Negative for rash.  Neurological:  Negative for dizziness, weakness, light-headedness, numbness and headaches.  Hematological:  Negative for adenopathy.  Psychiatric/Behavioral:  Negative for behavioral problems, dysphoric mood and sleep disturbance.    Per HPI unless specifically indicated above      Objective:    BP 132/84   Pulse 72   Ht 5' 9.5" (1.765 m)   Wt 181 lb (82.1 kg)   SpO2 98%   BMI 26.35 kg/m   Wt Readings from Last 3 Encounters:  07/21/23 181 lb (82.1 kg)  07/07/23 187 lb (84.8 kg)  02/03/23 179 lb (81.2 kg)    Physical Exam Vitals and nursing note reviewed.  Constitutional:      General: He is not in acute distress.    Appearance: He is well-developed. He is not diaphoretic.     Comments: Well-appearing, comfortable, cooperative  HENT:     Head: Normocephalic and  atraumatic.  Eyes:     General:        Right eye: No discharge.        Left eye: No discharge.     Conjunctiva/sclera: Conjunctivae normal.     Pupils: Pupils are equal, round, and reactive to light.  Neck:     Thyroid: No thyromegaly.     Vascular: No carotid bruit.  Cardiovascular:     Rate and Rhythm: Normal rate and regular rhythm.     Pulses: Normal pulses.     Heart sounds: Normal heart sounds. No murmur heard. Pulmonary:     Effort: Pulmonary effort is normal. No respiratory distress.     Breath sounds: Normal breath sounds. No wheezing or rales.  Abdominal:     General: Bowel sounds are normal. There is no distension.     Palpations: Abdomen is soft. There is no mass.     Tenderness: There is no abdominal tenderness.  Musculoskeletal:        General: No tenderness. Normal range of motion.     Cervical back: Normal range of motion and neck supple.     Right lower leg: No edema.     Left lower leg: No edema.     Comments: Upper / Lower Extremities: - Normal muscle tone, strength bilateral upper extremities 5/5, lower extremities 5/5  Lymphadenopathy:     Cervical: No cervical adenopathy.  Skin:    General: Skin is warm and dry.     Findings: No erythema or rash.  Neurological:     Mental Status: He is alert and oriented to person, place, and time.     Comments: Distal sensation intact to light touch all extremities  Psychiatric:        Mood and Affect: Mood normal.        Behavior: Behavior normal.        Thought Content: Thought content normal.  Comments: Well groomed, good eye contact, normal speech and thoughts      Diabetic Foot Exam - Simple   Simple Foot Form Diabetic Foot exam was performed with the following findings: Yes 07/21/2023  9:11 AM  Visual Inspection No deformities, no ulcerations, no other skin breakdown bilaterally: Yes Sensation Testing Intact to touch and monofilament testing bilaterally: Yes Pulse Check Posterior Tibialis and  Dorsalis pulse intact bilaterally: Yes Comments      Results for orders placed or performed in visit on 07/07/23  Hemoglobin A1c  Result Value Ref Range   Hgb A1c MFr Bld 6.1 (H) <5.7 % of total Hgb   Mean Plasma Glucose 128 mg/dL   eAG (mmol/L) 7.1 mmol/L  Lipid panel  Result Value Ref Range   Cholesterol 213 (H) <200 mg/dL   HDL 55 > OR = 40 mg/dL   Triglycerides 130 <865 mg/dL   LDL Cholesterol (Calc) 133 (H) mg/dL (calc)   Total CHOL/HDL Ratio 3.9 <5.0 (calc)   Non-HDL Cholesterol (Calc) 158 (H) <130 mg/dL (calc)  COMPLETE METABOLIC PANEL WITH GFR  Result Value Ref Range   Glucose, Bld 126 (H) 65 - 99 mg/dL   BUN 13 7 - 25 mg/dL   Creat 7.84 6.96 - 2.95 mg/dL   eGFR 99 > OR = 60 MW/UXL/2.44W1   BUN/Creatinine Ratio SEE NOTE: 6 - 22 (calc)   Sodium 141 135 - 146 mmol/L   Potassium 4.5 3.5 - 5.3 mmol/L   Chloride 105 98 - 110 mmol/L   CO2 27 20 - 32 mmol/L   Calcium 9.7 8.6 - 10.3 mg/dL   Total Protein 6.8 6.1 - 8.1 g/dL   Albumin 4.5 3.6 - 5.1 g/dL   Globulin 2.3 1.9 - 3.7 g/dL (calc)   AG Ratio 2.0 1.0 - 2.5 (calc)   Total Bilirubin 0.9 0.2 - 1.2 mg/dL   Alkaline phosphatase (APISO) 48 35 - 144 U/L   AST 17 10 - 35 U/L   ALT 21 9 - 46 U/L  CBC with Differential/Platelet  Result Value Ref Range   WBC 6.5 3.8 - 10.8 Thousand/uL   RBC 5.16 4.20 - 5.80 Million/uL   Hemoglobin 17.1 13.2 - 17.1 g/dL   HCT 02.7 (H) 25.3 - 66.4 %   MCV 97.1 80.0 - 100.0 fL   MCH 33.1 (H) 27.0 - 33.0 pg   MCHC 34.1 32.0 - 36.0 g/dL   RDW 40.3 47.4 - 25.9 %   Platelets 284 140 - 400 Thousand/uL   MPV 10.8 7.5 - 12.5 fL   Neutro Abs 4,076 1,500 - 7,800 cells/uL   Lymphs Abs 1,619 850 - 3,900 cells/uL   Absolute Monocytes 683 200 - 950 cells/uL   Eosinophils Absolute 72 15 - 500 cells/uL   Basophils Absolute 52 0 - 200 cells/uL   Neutrophils Relative % 62.7 %   Total Lymphocyte 24.9 %   Monocytes Relative 10.5 %   Eosinophils Relative 1.1 %   Basophils Relative 0.8 %  PSA   Result Value Ref Range   PSA 0.63 < OR = 4.00 ng/mL  Uric acid  Result Value Ref Range   Uric Acid, Serum 8.7 (H) 4.0 - 8.0 mg/dL  Urine Microalbumin w/creat. ratio  Result Value Ref Range   Creatinine, Urine 104 20 - 320 mg/dL   Microalb, Ur 1.1 mg/dL   Microalb Creat Ratio 11 <30 mg/g creat  HIV Antibody (routine testing w rflx)  Result Value Ref Range   HIV 1&2 Ab,  4th Generation NON-REACTIVE NON-REACTIVE  RPR  Result Value Ref Range   RPR Ser Ql NON-REACTIVE NON-REACTIVE  Urine cytology ancillary only  Result Value Ref Range   Neisseria Gonorrhea Negative    Chlamydia Negative    Trichomonas Negative    Comment Normal Reference Range Trichomonas - Negative    Comment Normal Reference Ranger Chlamydia - Negative    Comment      Normal Reference Range Neisseria Gonorrhea - Negative      Assessment & Plan:   Problem List Items Addressed This Visit     Type 2 diabetes mellitus with other specified complication (HCC)   Relevant Medications   MOUNJARO 7.5 MG/0.5ML Pen   Other Visit Diagnoses     Annual physical exam    -  Primary   Dry eye       Relevant Medications   LOTEMAX SM 0.38 % GEL   Entrapment of left ulnar nerve       Relevant Medications   baclofen (LIORESAL) 10 MG tablet       Updated Health Maintenance information Reviewed recent lab results with patient Encouraged improvement to lifestyle with diet and exercise Goal of weight loss  Assessment and Plan    Gout Episodic flares, recent flare while traveling. Uric acid level stable at 8.7. Flares managed effectively with early colchicine use. -Continue current management strategy with colchicine as needed. -Defer prophylaxis med  Prostate Health PSA level stable at 0.63 over seven years, indicating no signs of prostate cancer. -Continue monitoring PSA levels.  Hyperlipidemia Improvement in cholesterol levels over the past 4-5 years. The 10-year ASCVD risk score (Arnett DK, et al., 2019) is:  20.7% Offer Statin therapy. He declines today  Left Hand Tingling / Paresthesia vs Carpal Tunnel / Nerve impingement. Improvement noted over the past two weeks with chiropractic treatments. Patient expressed interest in trying Baclofen for further relief. -Prescribe Baclofen, monitor for improvement. -Consider neurology referral if no improvement in one month.  Dry, Cracking Skin on Hands Likely due to frequent hand washing. Patient has been using Sulfo with some improvement. -Advise on eczema guidelines:avoid hot water, gentle drying, less soap, and moisturize after every wash. -Consider topical steroid ointment if no improvement.  Type 2 Diabetes A1C level stable at 6.1, indicating good control. Patient to continue current management. -Check A1C and labs in 6 months. Restart Mounjaro for weight and A1c management going forward.  General Health Maintenance -Colonoscopy scheduled for November. -Continue Sildenafil as needed, up to 100mg  total. -Consider coronary calcium score scan for further cardiovascular risk assessment. -Continue routine STD checks every 6 months. -Consider Lotemax eye drops for dry eye symptoms.      No orders of the defined types were placed in this encounter.     Meds ordered this encounter  Medications   MOUNJARO 7.5 MG/0.5ML Pen    Sig: Inject 7.5 mg into the skin once a week.    Dispense:  6 mL    Refill:  1   LOTEMAX SM 0.38 % GEL    Sig: INSTILL 1 DROP INTO BOTH EYES THREE TIMES DAILY FOR 1WEEK    Dispense:  5 g    Refill:  2   baclofen (LIORESAL) 10 MG tablet    Sig: Take 0.5-1 tablets (5-10 mg total) by mouth 3 (three) times daily as needed for muscle spasms.    Dispense:  30 each    Refill:  1      Follow up plan: Return in about  6 months (around 01/18/2024) for 6 month follow-up DM A1c.  Saralyn Pilar, DO [facility] 07/21/2023, 8:40 AM

## 2023-07-23 DIAGNOSIS — F4320 Adjustment disorder, unspecified: Secondary | ICD-10-CM | POA: Diagnosis not present

## 2023-07-30 ENCOUNTER — Other Ambulatory Visit: Payer: Self-pay | Admitting: Family Medicine

## 2023-07-30 DIAGNOSIS — H04129 Dry eye syndrome of unspecified lacrimal gland: Secondary | ICD-10-CM

## 2023-07-30 DIAGNOSIS — F4320 Adjustment disorder, unspecified: Secondary | ICD-10-CM | POA: Diagnosis not present

## 2023-07-30 MED ORDER — LOTEMAX SM 0.38 % OP GEL: OPHTHALMIC | 2 refills | Status: AC

## 2023-08-06 DIAGNOSIS — F4320 Adjustment disorder, unspecified: Secondary | ICD-10-CM | POA: Diagnosis not present

## 2023-08-26 DIAGNOSIS — I480 Paroxysmal atrial fibrillation: Secondary | ICD-10-CM | POA: Diagnosis not present

## 2023-08-26 DIAGNOSIS — I4891 Unspecified atrial fibrillation: Secondary | ICD-10-CM | POA: Diagnosis not present

## 2023-09-03 DIAGNOSIS — F4320 Adjustment disorder, unspecified: Secondary | ICD-10-CM | POA: Diagnosis not present

## 2023-11-05 DIAGNOSIS — F4320 Adjustment disorder, unspecified: Secondary | ICD-10-CM | POA: Diagnosis not present

## 2023-11-11 LAB — HM DIABETES EYE EXAM

## 2023-11-12 DIAGNOSIS — D122 Benign neoplasm of ascending colon: Secondary | ICD-10-CM | POA: Diagnosis not present

## 2023-11-12 DIAGNOSIS — K640 First degree hemorrhoids: Secondary | ICD-10-CM | POA: Diagnosis not present

## 2023-11-12 DIAGNOSIS — Z1211 Encounter for screening for malignant neoplasm of colon: Secondary | ICD-10-CM | POA: Diagnosis not present

## 2023-11-12 DIAGNOSIS — K635 Polyp of colon: Secondary | ICD-10-CM | POA: Diagnosis not present

## 2023-11-12 DIAGNOSIS — D120 Benign neoplasm of cecum: Secondary | ICD-10-CM | POA: Diagnosis not present

## 2023-11-12 DIAGNOSIS — D125 Benign neoplasm of sigmoid colon: Secondary | ICD-10-CM | POA: Diagnosis not present

## 2023-11-12 DIAGNOSIS — D124 Benign neoplasm of descending colon: Secondary | ICD-10-CM | POA: Diagnosis not present

## 2023-11-12 DIAGNOSIS — Z860100 Personal history of colon polyps, unspecified: Secondary | ICD-10-CM | POA: Diagnosis not present

## 2023-11-12 LAB — HM COLONOSCOPY

## 2023-11-18 ENCOUNTER — Encounter: Payer: Self-pay | Admitting: Family Medicine

## 2023-11-19 ENCOUNTER — Ambulatory Visit: Payer: BC Managed Care – PPO | Admitting: Family Medicine

## 2023-11-19 DIAGNOSIS — F4320 Adjustment disorder, unspecified: Secondary | ICD-10-CM | POA: Diagnosis not present

## 2023-11-24 ENCOUNTER — Encounter: Payer: Self-pay | Admitting: Family Medicine

## 2023-11-24 ENCOUNTER — Ambulatory Visit (INDEPENDENT_AMBULATORY_CARE_PROVIDER_SITE_OTHER): Payer: BC Managed Care – PPO | Admitting: Family Medicine

## 2023-11-24 DIAGNOSIS — E1169 Type 2 diabetes mellitus with other specified complication: Secondary | ICD-10-CM | POA: Diagnosis not present

## 2023-11-24 DIAGNOSIS — M1009 Idiopathic gout, multiple sites: Secondary | ICD-10-CM | POA: Diagnosis not present

## 2023-11-24 MED ORDER — MOUNJARO 7.5 MG/0.5ML ~~LOC~~ SOAJ: 7.5000 mg | SUBCUTANEOUS | 1 refills | Status: DC

## 2023-11-24 MED ORDER — COLCHICINE 0.6 MG PO TABS: ORAL_TABLET | ORAL | 3 refills | Status: DC

## 2023-11-24 NOTE — Progress Notes (Signed)
Subjective:    Patient ID: Alec Hurst, male    DOB: Oct 21, 1961, 63 y.o.   MRN: [id number]  Alec Hurst is a 63 y.o. male presenting on 11/24/2023 for Diabetes   HPI  Discussed the use of AI scribe software for clinical note transcription with the patient, who gave verbal consent to proceed.  History of Present Illness    The patient presents with medication refill issues and follow-up after a colonoscopy.  He underwent a colonoscopy last week, during which ten polyps were removed and found to be benign. He had to stop Mounjaro for two to three weeks prior to the procedure, as per protocol, but faced a delay in resuming it due to dietary restrictions before the initial scheduled colonoscopy.  He is experiencing issues with refilling his Mounjaro prescription for dose 7.5mg , he has done well at this dose and continues to succeed. He would like re order. Back on Mounjaro but is on his last two shots and is unable to reorder online.  He has a history of gout with a recent flare-up last week. He almost ran out of colchicine but has two refills left until February 15th. He has gone months without a flare-up prior to this recent episode and is supposed to pick up a new bottle of colchicine today.  He mentions a significant weight loss after being sick the week after Christmas, dropping to 169 pounds from 178 pounds. His weight was previously 187 pounds in September. He attributes the weight loss to being sick and not eating for a week, rather than the use of Mounjaro.    Health Maintenance:  Last Colonoscopy done 10/2023 [facility] Dr Lonzo Cloud, repeat 1 year as planned 10/2024     07/21/2023    8:47 AM 07/07/2023   10:26 AM 02/03/2023    1:26 PM  Depression screen PHQ 2/9  Decreased Interest 1 1 0  Down, Depressed, Hopeless 1 1 0  PHQ - 2 Score 2 2 0  Altered sleeping 0 1 1  Tired, decreased energy 1 1 1   Change in appetite 0 0 0  Feeling bad or failure about  yourself  1 0 0  Trouble concentrating 0 1 0  Moving slowly or fidgety/restless 0 0 0  Suicidal thoughts 0 0 0  PHQ-9 Score 4 5 2   Difficult doing work/chores Somewhat difficult Somewhat difficult        07/21/2023    8:47 AM 07/07/2023   10:27 AM 02/03/2023    1:26 PM 12/31/2021    1:32 PM  GAD 7 : Generalized Anxiety Score  Nervous, Anxious, on Edge 1 1 1 1   Control/stop worrying 0 0 1 1  Worry too much - different things 1 0 1 1  Trouble relaxing 0 0 1 1  Restless 0 0 0 0  Easily annoyed or irritable 1 1 1 1   Afraid - awful might happen 0 0 0 0  Total GAD 7 Score 3 2 5 5   Anxiety Difficulty    Somewhat difficult    Social History   Tobacco Use   Smoking status: Never   Smokeless tobacco: Never  Vaping Use   Vaping status: Never Used  Substance Use Topics   Alcohol use: Yes    Alcohol/week: 0.0 standard drinks of alcohol    Comment: occasional   Drug use: No    Review of Systems Per HPI unless specifically indicated above     Objective:    BP 132/82  Pulse 68   Ht 5' 9.5" (1.765 m)   Wt 178 lb (80.7 kg)   SpO2 98%   BMI 25.91 kg/m   Wt Readings from Last 3 Encounters:  11/24/23 178 lb (80.7 kg)  07/21/23 181 lb (82.1 kg)  07/07/23 187 lb (84.8 kg)    Physical Exam Vitals and nursing note reviewed.  Constitutional:      General: He is not in acute distress.    Appearance: Normal appearance. He is well-developed. He is not diaphoretic.     Comments: Well-appearing, comfortable, cooperative  HENT:     Head: Normocephalic and atraumatic.  Eyes:     General:        Right eye: No discharge.        Left eye: No discharge.     Conjunctiva/sclera: Conjunctivae normal.  Cardiovascular:     Rate and Rhythm: Normal rate.  Pulmonary:     Effort: Pulmonary effort is normal.  Skin:    General: Skin is warm and dry.     Findings: No erythema or rash.  Neurological:     Mental Status: He is alert and oriented to person, place, and time.  Psychiatric:         Mood and Affect: Mood normal.        Behavior: Behavior normal.        Thought Content: Thought content normal.     Comments: Well groomed, good eye contact, normal speech and thoughts     Results for orders placed or performed in visit on 11/18/23  [facility] COLONOSCOPY   Collection Time: 11/12/23  9:00 AM  Result Value Ref Range   [facility] Colonoscopy See Report (in chart) See Report (in chart), Patient Reported      Assessment & Plan:   Problem List Items Addressed This Visit     Gout   Relevant Medications   colchicine 0.6 MG tablet   Type 2 diabetes mellitus with other specified complication (HCC)   Relevant Medications   MOUNJARO 7.5 MG/0.5ML Pen     Type 2 Diabetes Previously well controlled on Mounjaro stable on 7.5mg  dosing Overdue for new order [facility] prescription He had issue with required to [facility] for 2-3+ weeks since he had Colonoscopy attempted twice with bowel clean out. Unable to take GLP1 prior to procedure. -Submitted new prescription for [facility] 7.5mg  for 90 days with one refill to [facility] in [place].  Gout Recent flare-up managed with colchicine -Continue current management with colchicine as needed for flare-ups. Need re order today  Colonoscopy Recent procedure with removal of ten benign polyps. -Continue annual colonoscopies as per current plan per GI in [place]. They will schedule next  Follow-up Scheduled for March 31st, 2025. -Plan to check A1C, STD panel, and other basic labs as needed. -No need for fasting for cholesterol check as it is usually done annually and last check was in September.         No orders of the defined types were placed in this encounter.   Meds ordered this encounter  Medications   MOUNJARO 7.5 MG/0.5ML Pen    Sig: Inject 7.5 mg into the skin once a week.    Dispense:  6 mL    Refill:  1   colchicine 0.6 MG tablet    Sig: TAKE 2 TABLETS BY MOUTH IF GOUT FLARE, MAY REPEAT 1 TABLET IN 2 HOURS, THEN 1 TABLET  EVERY DAY FOR UP TO 7 TO 10 DAYS    Dispense:  30 tablet    Refill:  3    Follow up plan: Return if symptoms worsen or fail to improve.   Next visit POC A1c AFTER check labs Uric Acid, STD screening, CBC  Saralyn Pilar, DO [facility] 11/24/2023, 11:29 AM

## 2023-11-24 NOTE — Patient Instructions (Addendum)

## 2023-12-03 DIAGNOSIS — F4320 Adjustment disorder, unspecified: Secondary | ICD-10-CM | POA: Diagnosis not present

## 2023-12-17 DIAGNOSIS — N200 Calculus of kidney: Secondary | ICD-10-CM | POA: Diagnosis not present

## 2023-12-17 DIAGNOSIS — N201 Calculus of ureter: Secondary | ICD-10-CM | POA: Diagnosis not present

## 2023-12-17 DIAGNOSIS — N401 Enlarged prostate with lower urinary tract symptoms: Secondary | ICD-10-CM | POA: Diagnosis not present

## 2023-12-22 DIAGNOSIS — F4320 Adjustment disorder, unspecified: Secondary | ICD-10-CM | POA: Diagnosis not present

## 2024-01-12 DIAGNOSIS — F4320 Adjustment disorder, unspecified: Secondary | ICD-10-CM | POA: Diagnosis not present

## 2024-01-19 ENCOUNTER — Encounter: Payer: Self-pay | Admitting: Family Medicine

## 2024-01-19 ENCOUNTER — Other Ambulatory Visit (HOSPITAL_COMMUNITY)
Admission: RE | Admit: 2024-01-19 | Discharge: 2024-01-19 | Disposition: A | Discharge status: Home / Self Care | Source: Ambulatory Visit | Attending: Family Medicine | Admitting: Family Medicine

## 2024-01-19 ENCOUNTER — Ambulatory Visit (INDEPENDENT_AMBULATORY_CARE_PROVIDER_SITE_OTHER): Payer: BC Managed Care – PPO | Admitting: Family Medicine

## 2024-01-19 VITALS — BP 134/70 | HR 66 | Ht 69.5 in | Wt 174.0 lb

## 2024-01-19 DIAGNOSIS — Z7985 Long-term (current) use of injectable non-insulin antidiabetic drugs: Secondary | ICD-10-CM

## 2024-01-19 DIAGNOSIS — Z7252 High risk homosexual behavior: Secondary | ICD-10-CM

## 2024-01-19 DIAGNOSIS — M1009 Idiopathic gout, multiple sites: Secondary | ICD-10-CM

## 2024-01-19 DIAGNOSIS — Z113 Encounter for screening for infections with a predominantly sexual mode of transmission: Secondary | ICD-10-CM

## 2024-01-19 DIAGNOSIS — Z114 Encounter for screening for human immunodeficiency virus [HIV]: Secondary | ICD-10-CM

## 2024-01-19 DIAGNOSIS — E1169 Type 2 diabetes mellitus with other specified complication: Secondary | ICD-10-CM

## 2024-01-19 LAB — POCT GLYCOSYLATED HEMOGLOBIN (HGB A1C): Hemoglobin A1C: 5.5 % (ref 4.0–5.6)

## 2024-01-19 MED ORDER — MOUNJARO 7.5 MG/0.5ML ~~LOC~~ SOAJ: 7.5000 mg | SUBCUTANEOUS | 5 refills | Status: DC

## 2024-01-19 NOTE — Patient Instructions (Addendum)
 Thank you for coming to the office today.  Future recommend Prevnar-20 vaccine, here or at the pharmacy. One dose before 65 and then done.   Check with your Cardiologist about the Coronary Calcium Score Cardiac CT Scan. If they can do it, let them order it, if not, we can order it.  This is a screening test for patients aged 62-50+ with cardiovascular risk factors or who are healthy but would be interested in Cardiovascular Screening for heart disease. Even if there is a family history of heart disease, this imaging can be useful. Typically it can be done every 5+ years or at a different timeline we agree on  The scan will look at the chest and mainly focus on the heart and identify early signs of calcium build up or blockages within the heart arteries. It is not 100% accurate for identifying blockages or heart disease, but it is useful to help [place] predict who may have some early changes or be at risk in the future for a heart attack or cardiovascular problem.  The results are reviewed by a Cardiologist and they will document the results. It should become available on MyChart. Typically the results are divided into percentiles based on other patients of the same demographic and age. So it will compare your risk to others similar to you. If you have a higher score >99 or higher percentile >75%tile, it is recommended to consider Statin cholesterol therapy and or referral to Cardiologist. I will try to help explain your results and if we have questions we can contact the Cardiologist.  You will be contacted for scheduling. Usually it is done at any imaging facility through [facility], [facility] or [facility].  The cost is $99 flat fee total and it does not go through insurance, so no authorization is required.  DUE for FASTING BLOOD WORK (no food or drink after midnight before the lab appointment, only water or coffee without cream/sugar on the morning of)  SCHEDULE  "Lab Only" visit in the morning at the clinic for lab draw in 6 MONTHS   - Make sure Lab Only appointment is at about 1 week before your next appointment, so that results will be available  For Lab Results, once available within 2-3 days of blood draw, you can can log in to MyChart online to view your results and a brief explanation. Also, we can discuss results at next follow-up visit.[email or web address]    Please schedule a Follow-up Appointment to: Return in about 6 months (around 07/20/2024) for 6 month Annual Physical fasting lab after.  If you have any other questions or concerns, please feel free to call the office or send a message through MyChart. You may also schedule an earlier appointment if necessary.  Additionally, you may be receiving a survey about your experience at our office within a few days to 1 week by e-mail or mail. We value your feedback.  Saralyn Pilar, DO [facility], [place]

## 2024-01-19 NOTE — Progress Notes (Signed)
 Subjective:    Patient ID: Alec Hurst, male    DOB: Jun 08, 1961, 63 y.o.   MRN: [id number]  Alec Hurst is a 63 y.o. male presenting on 01/19/2024 for Diabetes   HPI  Discussed the use of AI scribe software for clinical note transcription with the patient, who gave verbal consent to proceed.  History of Present Illness   Alec Hurst is a 63 year old male with diabetes who presents for a six-month follow-up visit.  He has experienced significant improvement in blood sugar levels, with a recent A1c reading of 5.5, down from 6.1. This improvement is attributed to his current medication regimen, which includes Mounjaro. He has been extending the medication interval to every 12 to 14 days, noting increased appetite and cravings for sweets when the interval is too long.  He describes a recent episode of significant upper abdominal pain, likened to an 'air bubble,' which was severe and lasted about 24 hours, leaving the area sore. He associates this with eating larger portions than usual, including two pieces of pie, and wonders if it could be related to gastric emptying issues due to his medication.  He experienced a gout flare-up last month, which he managed with colchicine. He was able to address the onset early but is unsure of the exact trigger, as he had not consumed red meat or alcohol.  His weight is stable at 174 pounds.         01/19/2024    8:32 AM 07/21/2023    8:47 AM 07/07/2023   10:26 AM  Depression screen PHQ 2/9  Decreased Interest 1 1 1   Down, Depressed, Hopeless 2 1 1   PHQ - 2 Score 3 2 2   Altered sleeping 1 0 1  Tired, decreased energy 1 1 1   Change in appetite 0 0 0  Feeling bad or failure about yourself  2 1 0  Trouble concentrating 0 0 1  Moving slowly or fidgety/restless 0 0 0  Suicidal thoughts 0 0 0  PHQ-9 Score 7 4 5   Difficult doing work/chores Very difficult Somewhat difficult Somewhat difficult       01/19/2024    8:32 AM 07/21/2023    8:47  AM 07/07/2023   10:27 AM 02/03/2023    1:26 PM  GAD 7 : Generalized Anxiety Score  Nervous, Anxious, on Edge 2 1 1 1   Control/stop worrying 2 0 0 1  Worry too much - different things 2 1 0 1  Trouble relaxing 2 0 0 1  Restless 0 0 0 0  Easily annoyed or irritable 1 1 1 1   Afraid - awful might happen 2 0 0 0  Total GAD 7 Score 11 3 2 5   Anxiety Difficulty Somewhat difficult       Social History   Tobacco Use   Smoking status: Never   Smokeless tobacco: Never  Vaping Use   Vaping status: Never Used  Substance Use Topics   Alcohol use: Yes    Alcohol/week: 0.0 standard drinks of alcohol    Comment: occasional   Drug use: No    Review of Systems Per HPI unless specifically indicated above     Objective:    BP 134/70 (BP Location: Left Arm, Cuff Size: Normal)   Pulse 66   Ht 5' 9.5" (1.765 m)   Wt 174 lb (78.9 kg)   SpO2 98%   BMI 25.33 kg/m   Wt Readings from Last 3 Encounters:  01/19/24 174 lb (78.9 kg)  11/24/23 178 lb (80.7 kg)  07/21/23 181 lb (82.1 kg)    Physical Exam Vitals and nursing note reviewed.  Constitutional:      General: He is not in acute distress.    Appearance: Normal appearance. He is well-developed. He is not diaphoretic.     Comments: Well-appearing, comfortable, cooperative  HENT:     Head: Normocephalic and atraumatic.  Eyes:     General:        Right eye: No discharge.        Left eye: No discharge.     Conjunctiva/sclera: Conjunctivae normal.  Cardiovascular:     Rate and Rhythm: Normal rate.  Pulmonary:     Effort: Pulmonary effort is normal.  Skin:    General: Skin is warm and dry.     Findings: No erythema or rash.  Neurological:     Mental Status: He is alert and oriented to person, place, and time.  Psychiatric:        Mood and Affect: Mood normal.        Behavior: Behavior normal.        Thought Content: Thought content normal.     Comments: Well groomed, good eye contact, normal speech and thoughts     Results  for orders placed or performed in visit on 01/19/24  POCT HgB A1C   Collection Time: 01/19/24  8:36 AM  Result Value Ref Range   Hemoglobin A1C 5.5 4.0 - 5.6 %   HbA1c POC (<> result, manual entry)     HbA1c, POC (prediabetic range)     HbA1c, POC (controlled diabetic range)        Assessment & Plan:   Problem List Items Addressed This Visit     Gout   High risk sexual behavior   Relevant Orders   GC/Chlamydia probe amp ([place])not at [facility]   RPR   Type 2 diabetes mellitus with other specified complication (HCC) - Primary   Relevant Medications   MOUNJARO 7.5 MG/0.5ML Pen   Other Relevant Orders   POCT HgB A1C (Completed)   Other Visit Diagnoses       Screening for HIV (human immunodeficiency virus)       Relevant Orders   HIV Antibody (routine testing w rflx)     Screening examination for STD (sexually transmitted disease)       Relevant Orders   GC/Chlamydia probe amp ([place])not at [facility]   HIV Antibody (routine testing w rflx)   RPR     Long-term current use of injectable noninsulin antidiabetic medication            Type 2 Diabetes Mellitus Type 2 Diabetes Mellitus is well-controlled with Mounjaro, as evidenced by a reduction in A1c from 6.1 to 5.5. He manages medication intervals effectively, extending doses to every 12-14 days, aiding in appetite and craving control. The current dosage is effective, contributing to weight maintenance and improved glycemic control, with a weight of 174 lbs. - Renew Mounjaro 7.5 prescription with five refills for six months. - Monitor blood glucose levels regularly.  Digestive Discomfort He experienced upper abdominal pain, likely related to gastric emptying issues due to [facility]. Symptoms included heaviness and soreness, possibly exacerbated by larger meal portions and extended dosing intervals. Pain was relieved by applying pressure and possibly related to overeating. - Consider antacid treatments for symptom relief. -  Monitor for recurrence of symptoms.  Gout Gout is well-managed with occasional flares. He experienced an onset last month but  managed it effectively with colchicine. The exact trigger for flares is unclear, but he is aware of potential dietary influences. - Continue colchicine as needed for gout flares.  Elevated BP Blood pressure reading was 142/82 initially, with improvement to 132/70-74. He is advised to monitor blood pressure regularly. - Monitor blood pressure regularly.  General Health Maintenance Discussion on updated guidelines for the pneumonia vaccine, recommending Prevnar 20 for adults 50 and older. The new guidelines suggest vaccination before age 31 to avoid needing another dose at 24. Prevnar 20 covers more strains than Prevnar 13 and is effective for 5-10 years with minimal side effects. - Recommend Prevnar 20 vaccine at pharmacy or clinic.  Follow-up Routine follow-up and screenings are planned, including a six-month check-up and annual physical. He is advised to consult with a cardiologist regarding a calcium score CT scan for heart health assessment. The scan provides peace of mind and can be done at a cost-effective rate. - Schedule six-month follow-up appointment in September for annual physical and fasting labs. - Consult with cardiologist about calcium score CT scan. - Complete STD panel including blood and urine tests.        Orders Placed This Encounter  Procedures   HIV Antibody (routine testing w rflx)   RPR   POCT HgB A1C    Meds ordered this encounter  Medications   MOUNJARO 7.5 MG/0.5ML Pen    Sig: Inject 7.5 mg into the skin once a week.    Dispense:  2 mL    Refill:  5    Add refills on file.    Follow up plan: Return in about 6 months (around 07/20/2024) for 6 month Annual Physical fasting lab after.   Saralyn Pilar, DO [facility] 01/19/2024, 8:47 AM

## 2024-01-20 ENCOUNTER — Encounter: Payer: Self-pay | Admitting: Family Medicine

## 2024-01-20 LAB — GC/CHLAMYDIA PROBE AMP (~~LOC~~) NOT AT ARMC
Chlamydia: NEGATIVE
Comment: NEGATIVE
Comment: NORMAL
Neisseria Gonorrhea: NEGATIVE

## 2024-01-20 LAB — RPR: RPR Ser Ql: NONREACTIVE

## 2024-01-20 LAB — HIV ANTIBODY (ROUTINE TESTING W REFLEX): HIV 1&2 Ab, 4th Generation: NONREACTIVE

## 2024-02-23 DIAGNOSIS — N200 Calculus of kidney: Secondary | ICD-10-CM | POA: Diagnosis not present

## 2024-02-23 DIAGNOSIS — N5203 Combined arterial insufficiency and corporo-venous occlusive erectile dysfunction: Secondary | ICD-10-CM | POA: Diagnosis not present

## 2024-06-14 ENCOUNTER — Telehealth: Payer: Self-pay

## 2024-06-14 DIAGNOSIS — E1169 Type 2 diabetes mellitus with other specified complication: Secondary | ICD-10-CM

## 2024-06-14 MED ORDER — MOUNJARO 5 MG/0.5ML ~~LOC~~ SOAJ: 5.0000 mg | SUBCUTANEOUS | 2 refills | Status: DC

## 2024-06-14 NOTE — Addendum Note (Signed)
 Addended by: EDMAN MARSA PARAS on: 06/14/2024 02:05 PM   Modules accepted: Orders

## 2024-06-14 NOTE — Telephone Encounter (Signed)
 Copied from CRM [id number]. Topic: Clinical - Medication Question >> Jun 14, 2024 12:34 PM Larissa S wrote: Reason for CRM: Patient requesting to change MOUNJARO  7.5 MG/0.5ML Pen to mounjaro  5mg  due to being off the medication and states the 7.5 mg made him sick. Patient states he was out of town for work and was unable to take medication.   Pharmacy:  [id number] - [address], [place] - [address] AT [facility] OF [address] [place] [address] Phone: [phone] Fax: [phone] Hours: Not open 24 hours

## 2024-06-14 NOTE — Telephone Encounter (Signed)
 LMTCB. Okay to advise below if patients call back.

## 2024-06-14 NOTE — Telephone Encounter (Signed)
 Please notify patient rx sent for Mounjaro  5mg  weekly inj to his [facility]. I did add 2 additional refills if needed, so it is up to 90 day on the 5mg . If he wants to switch back to 7.5 in future, let me know  Marsa Officer, DO [facility] 06/14/2024, 2:05 PM

## 2024-06-16 ENCOUNTER — Telehealth: Payer: Self-pay

## 2024-06-16 NOTE — Telephone Encounter (Signed)
 Copied from CRM [id number]. Topic: Clinical - Medication Question >> Jun 16, 2024  3:52 PM Alec Hurst wrote: Patient is calling back to let Dr. Edman know that his insurance provider received the prior authorization for MOUNJARO  5 MG/0.5ML Pen

## 2024-06-16 NOTE — Telephone Encounter (Signed)
 Copied from CRM [id number]. Topic: Clinical - Medication Question >> Jun 14, 2024 12:34 PM Larissa S wrote: Reason for CRM: Patient requesting to change MOUNJARO  7.5 MG/0.5ML Pen to mounjaro  5mg  due to being off the medication and states the 7.5 mg made him sick. Patient states he was out of town for work and was unable to take medication.   Pharmacy:  [id number] - [address] [place] - [address] AT [facility] [address] [place] [address] Phone: [phone] Fax: [phone] Hours: Not open 24 hours

## 2024-06-17 ENCOUNTER — Other Ambulatory Visit (HOSPITAL_COMMUNITY): Payer: Self-pay

## 2024-06-23 ENCOUNTER — Other Ambulatory Visit (HOSPITAL_COMMUNITY): Payer: Self-pay

## 2024-07-02 ENCOUNTER — Other Ambulatory Visit (HOSPITAL_COMMUNITY): Payer: Self-pay

## 2024-07-05 ENCOUNTER — Other Ambulatory Visit (HOSPITAL_COMMUNITY)
Admission: RE | Admit: 2024-07-05 | Discharge: 2024-07-05 | Disposition: A | Discharge status: Home / Self Care | Source: Ambulatory Visit | Attending: Family Medicine | Admitting: Family Medicine

## 2024-07-05 ENCOUNTER — Ambulatory Visit (INDEPENDENT_AMBULATORY_CARE_PROVIDER_SITE_OTHER): Admitting: Family Medicine

## 2024-07-05 ENCOUNTER — Encounter: Payer: Self-pay | Admitting: Family Medicine

## 2024-07-05 VITALS — BP 132/84 | HR 66 | Ht 69.5 in | Wt 181.0 lb

## 2024-07-05 DIAGNOSIS — R351 Nocturia: Secondary | ICD-10-CM

## 2024-07-05 DIAGNOSIS — N529 Male erectile dysfunction, unspecified: Secondary | ICD-10-CM

## 2024-07-05 DIAGNOSIS — B360 Pityriasis versicolor: Secondary | ICD-10-CM

## 2024-07-05 DIAGNOSIS — Z23 Encounter for immunization: Secondary | ICD-10-CM

## 2024-07-05 DIAGNOSIS — E1169 Type 2 diabetes mellitus with other specified complication: Secondary | ICD-10-CM | POA: Diagnosis not present

## 2024-07-05 DIAGNOSIS — Z7985 Long-term (current) use of injectable non-insulin antidiabetic drugs: Secondary | ICD-10-CM

## 2024-07-05 DIAGNOSIS — Z0000 Encounter for general adult medical examination without abnormal findings: Secondary | ICD-10-CM | POA: Diagnosis not present

## 2024-07-05 DIAGNOSIS — Z113 Encounter for screening for infections with a predominantly sexual mode of transmission: Secondary | ICD-10-CM | POA: Insufficient documentation

## 2024-07-05 DIAGNOSIS — M1009 Idiopathic gout, multiple sites: Secondary | ICD-10-CM

## 2024-07-05 DIAGNOSIS — Z7252 High risk homosexual behavior: Secondary | ICD-10-CM | POA: Insufficient documentation

## 2024-07-05 DIAGNOSIS — E785 Hyperlipidemia, unspecified: Secondary | ICD-10-CM | POA: Diagnosis not present

## 2024-07-05 DIAGNOSIS — I714 Abdominal aortic aneurysm, without rupture, unspecified: Secondary | ICD-10-CM

## 2024-07-05 DIAGNOSIS — F330 Major depressive disorder, recurrent, mild: Secondary | ICD-10-CM

## 2024-07-05 DIAGNOSIS — G5622 Lesion of ulnar nerve, left upper limb: Secondary | ICD-10-CM

## 2024-07-05 DIAGNOSIS — Z114 Encounter for screening for human immunodeficiency virus [HIV]: Secondary | ICD-10-CM

## 2024-07-05 DIAGNOSIS — Z125 Encounter for screening for malignant neoplasm of prostate: Secondary | ICD-10-CM

## 2024-07-05 MED ORDER — COLCHICINE 0.6 MG PO TABS: ORAL_TABLET | ORAL | 3 refills | Status: AC

## 2024-07-05 MED ORDER — COVID-19 MRNA VAC-TRIS(PFIZER) 30 MCG/0.3ML IM SUSY: 0.3000 mL | PREFILLED_SYRINGE | Freq: Once | INTRAMUSCULAR | 0 refills | Status: AC

## 2024-07-05 MED ORDER — KETOCONAZOLE 2 % EX SHAM: 1.0000 | MEDICATED_SHAMPOO | Freq: Every day | CUTANEOUS | 1 refills | Status: AC | PRN

## 2024-07-05 MED ORDER — SILDENAFIL CITRATE 20 MG PO TABS: 20.0000 mg | ORAL_TABLET | ORAL | 2 refills | Status: AC | PRN

## 2024-07-05 MED ORDER — MOUNJARO 5 MG/0.5ML ~~LOC~~ SOAJ: 5.0000 mg | SUBCUTANEOUS | 2 refills | Status: AC

## 2024-07-05 MED ORDER — MOUNJARO 5 MG/0.5ML ~~LOC~~ SOAJ: 5.0000 mg | SUBCUTANEOUS | 2 refills | Status: DC

## 2024-07-05 MED ORDER — BACLOFEN 10 MG PO TABS: 5.0000 mg | ORAL_TABLET | Freq: Three times a day (TID) | ORAL | 1 refills | Status: AC | PRN

## 2024-07-05 NOTE — Patient Instructions (Addendum)
 Thank you for coming to the office today.  Mounjaro  5mg  for 1 more month, then try the 7.5  Please Call Dr Redell Bullocks to schedule Colonoscopy 10/2024 as planned for 1 year  Recommend Pharmacy Pneumonia vaccine Prevnar-20  Future COVID Vaccine at pharmacy  Flu Shot today  All meds refilled  Labs today  Please schedule a Follow-up Appointment to: Return for 6 month DM A1c.  If you have any other questions or concerns, please feel free to call the office or send a message through MyChart. You may also schedule an earlier appointment if necessary.  Additionally, you may be receiving a survey about your experience at our office within a few days to 1 week by e-mail or mail. We value your feedback.  Marsa Officer, DO [facility], [place]

## 2024-07-05 NOTE — Progress Notes (Signed)
 Subjective:    Patient ID: Alec Hurst, male    DOB: Nov 14, 1960, 64 y.o.   MRN: [id number]  Alec Hurst is a 63 y.o. male presenting on 07/05/2024 for Annual Exam   HPI  Discussed the use of AI scribe software for clinical note transcription with the patient, who gave verbal consent to proceed.  History of Present Illness   Alec Hurst is a 63 year old male who presents for an annual physical exam.  Gout flare - Acute gout symptoms began yesterday - Colchicine  regimen: two tablets at onset, one tablet two hours later, then one daily for seven to ten days - Requires colchicine  availability for management  Abdominal aortic aneurysm surveillance - Small abdominal aortic aneurysm identified in 2021, below aneurysm range 2.8 cm see below - Recommended follow-up in five years 2026 - Family history of aneurysm (father affected)  Musculoskeletal and anxiety management - Baclofen  used as needed for muscle relaxation - Propranolol  used as needed for anxiety  Erectile dysfunction management - Sildenafil  used as prescribed by urologist Request new order now, 20mg  up to 2-3 per dose PRN  Dermatologic symptoms - Ketoconazole  shampoo used for dry scalp     Type 2 Diabetes - Mounjaro  therapy interrupted for one month in August - 7 mg dose caused significant adverse effects, including two days of sickness - Prefers to continue with 5 mg dose - Pharmacy switched from [facility] to [facility] due to [employer] Due for A1c CBGs: Not checking CBG OFF Metformin  Lifestyle: - Diet (balanced diet, no particular diet plan) - Exercise (working on improving regular exercise)   HYPERLIPIDEMIA: Reviewed lab lipid panel Not on Statin therapy He is followed by Cardiologist.   Alec Hurst Entrapment / Tingling, Left arm / Hand Reports symptoms started in May 2024. He describes left hand constant tingling numbness, mostly lateral ulnar edge of palm and fingers 4th and 5th. He has  seen chiropractor and continues to work on this for Hurst symptoms. 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 Not ready for medication. Admits mid life crisis multiple issues affecting him. Previously took SSRI Escitalopram  5-10mg  previously 2017 and earlier, temporary. But does have some sexual side effects. Not on med   Gout Chronic flares, episodic Recently did have one flare while traveling, improved if takes Colchicine  early to prevent flare. Last Uric Acid 8.7  He has experienced significant improvement in blood sugar levels, with a recent A1c reading of 5.5, down from 6.1. This improvement is attributed to his current medication regimen, which includes Mounjaro . He has been extending the medication interval to every 12 to 14 days, noting increased appetite and cravings for sweets when the interval is too long.   He describes a recent episode of significant upper abdominal pain, likened to an 'air bubble,' which was severe and lasted about 24 hours, leaving the area sore. He associates this with eating larger portions than usual, including two pieces of pie, and wonders if it could be related to gastric emptying issues due to his medication.   He experienced a gout flare-up last month, which he managed with colchicine . He was able to address the onset early but is unsure of the exact trigger, as he had not consumed red meat or alcohol.   His weight is stable at 174 pounds.   Mild Ectasia of Abdominal Aorta Last documented on US  Aorta 08/29/20  with 2.8 cm, repeat 5 years Family history of aneurysm Next due 2026      Health Maintenance:   PSA 0.63 (2024) negative, stable over past 7 years Repeat today   Last Colonoscopy 10/2023 Dr Redell Bullocks [facility], multiple polyps, repeat 1 year, 10/2024  Eligible for Prevnar-20 pneumonia vaccine at pharmacy and  Shingles vaccine     07/05/2024    8:13 AM 01/19/2024    8:32 AM 07/21/2023    8:47 AM  Depression screen PHQ 2/9  Decreased Interest 0 1 1  Down, Depressed, Hopeless 1 2 1   PHQ - 2 Score 1 3 2   Altered sleeping 0 1 0  Tired, decreased energy 1 1 1   Change in appetite 0 0 0  Feeling bad or failure about yourself  0 2 1  Trouble concentrating 0 0 0  Moving slowly or fidgety/restless 0 0 0  Suicidal thoughts 0 0 0  PHQ-9 Score 2 7 4   Difficult doing work/chores Not difficult at all Very difficult Somewhat difficult       07/05/2024    8:14 AM 01/19/2024    8:32 AM 07/21/2023    8:47 AM 07/07/2023   10:27 AM  GAD 7 : Generalized Anxiety Score  Nervous, Anxious, on Edge 1 2 1 1   Control/stop worrying 1 2 0 0  Worry too much - different things 0 2 1 0  Trouble relaxing 0 2 0 0  Restless 0 0 0 0  Easily annoyed or irritable 0 1 1 1   Afraid - awful might happen 0 2 0 0  Total GAD 7 Score 2 11 3 2   Anxiety Difficulty Not difficult at all Somewhat difficult       Past Medical History:  Diagnosis Date   Calculus of kidney    Delayed ejaculation    ED (erectile dysfunction)    Hx of colonic polyps    Impotence, organic    Malaise and fatigue    Panic disorder with agoraphobia    Sciatica    Past Surgical History:  Procedure Laterality Date   LITHOTRIPSY     Social History   Socioeconomic History   Marital status: Married    Spouse name: Not on file   Number of children: Not on file   Years of education: Not on file   Highest education level: Doctorate  Occupational History   Occupation: Musician Biomedical scientist, Geophysicist/field seismologist)  Tobacco Use   Smoking status: Never   Smokeless tobacco: Never  Vaping Use   Vaping status: Never Used  Substance and Sexual Activity   Alcohol use: Yes    Alcohol/week: 0.0 standard drinks of alcohol    Comment: occasional   Drug use: No   Sexual activity: Not on file  Other Topics Concern   Not on file  Social History Narrative   Not  on file   Social Drivers of Health   Financial Resource Strain: Low Risk  (11/20/2023)   Overall Financial Resource Strain (CARDIA)    Difficulty of Paying Living Expenses: Not very hard  Food Insecurity: Patient Declined (11/20/2023)   Hunger Vital Sign    Worried About Running Out of Food in the Last Year: Patient declined    Ran Out of Food in the Last Year: Patient declined  Transportation Needs: No Transportation Needs (11/20/2023)   PRAPARE - Administrator, Civil Service (Medical): No    Lack of Transportation (Non-Medical): No  Physical Activity: Insufficiently Active (11/20/2023)  Exercise Vital Sign    Days of Exercise per Week: 1 day    Minutes of Exercise per Session: 40 min  Stress: Stress Concern Present (11/20/2023)   [employer] of Occupational Health - Occupational Stress Questionnaire    Feeling of Stress : Rather much  Social Connections: Socially Integrated (11/20/2023)   Social Connection and Isolation Panel    Frequency of Communication with Friends and Family: More than three times a week    Frequency of Social Gatherings with Friends and Family: Twice a week    Attends Religious Services: More than 4 times per year    Active Member of [employer] or Organizations: Yes    Attends Engineer, structural: More than 4 times per year    Marital Status: Married  Catering manager Violence: Not on file   Family History  Problem Relation Age of Onset   Colon cancer Maternal Aunt    Other Mother        rx med addiction before passing   Heart attack Paternal Uncle 28   Current Outpatient Medications on File Prior to Visit  Medication Sig   Loteprednol  Etabonate (LOTEMAX  SM) 0.38 % GEL INSTILL 1 DROP INTO BOTH EYES THREE TIMES DAILY FOR 1WEEK   Melatonin 5 MG TABS Take 5 mg by mouth at bedtime as needed.   propranolol  (INDERAL ) 10 MG tablet Take 1 tablet (10 mg total) by mouth daily as needed (performance anxiety, panic).   No current  facility-administered medications on file prior to visit.    Review of Systems  Constitutional:  Negative for activity change, appetite change, chills, diaphoresis, fatigue and fever.  HENT:  Negative for congestion and hearing loss.   Eyes:  Negative for visual disturbance.  Respiratory:  Negative for cough, chest tightness, shortness of breath and wheezing.   Cardiovascular:  Negative for chest pain, palpitations and leg swelling.  Gastrointestinal:  Negative for abdominal pain, constipation, diarrhea, nausea and vomiting.  Genitourinary:  Negative for dysuria, frequency and hematuria.  Musculoskeletal:  Negative for arthralgias and neck pain.  Skin:  Negative for rash.  Neurological:  Negative for dizziness, weakness, light-headedness, numbness and headaches.  Hematological:  Negative for adenopathy.  Psychiatric/Behavioral:  Negative for behavioral problems, dysphoric mood and sleep disturbance.    Per HPI unless specifically indicated above     Objective:    BP 132/84 (BP Location: Left Arm, Patient Position: Sitting, Cuff Size: Normal)   Pulse 66   Ht 5' 9.5 (1.765 m)   Wt 181 lb (82.1 kg)   SpO2 98%   BMI 26.35 kg/m   Wt Readings from Last 3 Encounters:  07/05/24 181 lb (82.1 kg)  01/19/24 174 lb (78.9 kg)  11/24/23 178 lb (80.7 kg)    Physical Exam Vitals and nursing note reviewed.  Constitutional:      General: He is not in acute distress.    Appearance: He is well-developed. He is not diaphoretic.     Comments: Well-appearing, comfortable, cooperative  HENT:     Head: Normocephalic and atraumatic.  Eyes:     General:        Right eye: No discharge.        Left eye: No discharge.     Conjunctiva/sclera: Conjunctivae normal.     Pupils: Pupils are equal, round, and reactive to light.  Neck:     Thyroid : No thyromegaly.     Vascular: No carotid bruit.  Cardiovascular:     Rate and Rhythm: Normal  rate and regular rhythm.     Pulses: Normal pulses.      Heart sounds: Normal heart sounds. No murmur heard. Pulmonary:     Effort: Pulmonary effort is normal. No respiratory distress.     Breath sounds: Normal breath sounds. No wheezing or rales.  Abdominal:     General: Bowel sounds are normal. There is no distension.     Palpations: Abdomen is soft. There is no mass.     Tenderness: There is no abdominal tenderness.  Musculoskeletal:        General: No tenderness. Normal range of motion.     Cervical back: Normal range of motion and neck supple.     Right lower leg: No edema.     Left lower leg: No edema.     Comments: Upper / Lower Extremities: - Normal muscle tone, strength bilateral upper extremities 5/5, lower extremities 5/5  Lymphadenopathy:     Cervical: No cervical adenopathy.  Skin:    General: Skin is warm and dry.     Findings: No erythema or rash.  Neurological:     Mental Status: He is alert and oriented to person, place, and time.     Comments: Distal sensation intact to light touch all extremities  Psychiatric:        Mood and Affect: Mood normal.        Behavior: Behavior normal.        Thought Content: Thought content normal.     Comments: Well groomed, good eye contact, normal speech and thoughts     Diabetic Foot Exam - Simple   Simple Foot Form Diabetic Foot exam was performed with the following findings: Yes 07/05/2024  8:36 AM  Visual Inspection No deformities, no ulcerations, no other skin breakdown bilaterally: Yes Sensation Testing Intact to touch and monofilament testing bilaterally: Yes Pulse Check Posterior Tibialis and Dorsalis pulse intact bilaterally: Yes Comments      I have personally reviewed the radiology report from 08/29/20 on Aorta US .  CLINICAL DATA:  Ectasia of the abdominal aorta seen on outside MRI. Evaluate for abdominal aortic aneurysm.   EXAM: ULTRASOUND OF ABDOMINAL AORTA   TECHNIQUE: Ultrasound examination of the abdominal aorta and proximal common iliac arteries was  performed to evaluate for aneurysm. Additional color and Doppler images of the distal aorta were obtained to document patency.   COMPARISON:  None.   FINDINGS: Abdominal aortic measurements as follows:   Proximal:  3.1 x 2.9 cm   Mid:  2.2 x 2.2 cm   Distal:  2.8 x 2.7 cm Patent: Yes, peak systolic velocity is 62 cm/s   There is a moderate amount of eccentric mixed echogenic plaque involving the mid and distal aspects of the abdominal aorta (representative image 12).   Right common iliac artery: 1.4 x 1.3 cm   Left common iliac artery: 1.4 x 1.3 cm   There is diffuse increased slightly coarsened echogenicity of the hepatic parenchyma suggestive of hepatic steatosis.   IMPRESSION: 1. Mild ectasia of the distal aspect of the abdominal aorta measuring 2.8 cm in diameter. Recommend follow-up aortic ultrasound in 5 years. This recommendation follows ACR consensus guidelines: White Paper of the ACR Incidental Findings Committee II on Vascular Findings. J Am Coll Radiol 2013; 10:789-794. 2.  Aortic Atherosclerosis (ICD10-I70.0). 3. Increased slightly coarsened echogenicity of the hepatic parenchyma suggestive of hepatic steatosis. Correlation with LFTs is advised.     Electronically Signed   By: Norleen Adele HERO.D.  On: 08/29/2020 09:05  Results for orders placed or performed in visit on 01/19/24  POCT HgB A1C   Collection Time: 01/19/24  8:36 AM  Result Value Ref Range   Hemoglobin A1C 5.5 4.0 - 5.6 %   HbA1c POC (<> result, manual entry)     HbA1c, POC (prediabetic range)     HbA1c, POC (controlled diabetic range)    GC/Chlamydia probe amp ([place])not at [facility]   Collection Time: 01/19/24  9:00 AM  Result Value Ref Range   Neisseria Gonorrhea Negative    Chlamydia Negative    Comment Normal Reference Ranger Chlamydia - Negative    Comment      Normal Reference Range Neisseria Gonorrhea - Negative  HIV Antibody (routine testing w rflx)   Collection Time:  01/19/24  9:23 AM  Result Value Ref Range   HIV 1&2 Ab, 4th Generation NON-REACTIVE NON-REACTIVE  RPR   Collection Time: 01/19/24  9:23 AM  Result Value Ref Range   RPR Ser Ql NON-REACTIVE NON-REACTIVE      Assessment & Plan:   Problem List Items Addressed This Visit     Abdominal aortic aneurysm (AAA) without rupture (HCC)   Relevant Medications   sildenafil  (REVATIO ) 20 MG tablet   ED (erectile dysfunction)   Relevant Medications   sildenafil  (REVATIO ) 20 MG tablet   Gout   Relevant Medications   colchicine  0.6 MG tablet   Other Relevant Orders   Uric acid   High risk sexual behavior   Relevant Orders   HIV Antibody (routine testing w rflx)   RPR   GC/Chlamydia probe amp ()not at [facility]   Hyperlipidemia associated with type 2 diabetes mellitus (HCC)   Relevant Medications   sildenafil  (REVATIO ) 20 MG tablet   MOUNJARO  5 MG/0.5ML Pen   Other Relevant Orders   Lipid panel   TSH   Comprehensive metabolic panel with GFR   Mild episode of recurrent major depressive disorder (HCC)   Nocturia   Relevant Orders   PSA   Screening for prostate cancer   Relevant Orders   PSA   Type 2 diabetes mellitus with other specified complication (HCC)   Relevant Medications   COVID-19 mRNA vaccine, Pfizer, (COMIRNATY) syringe   MOUNJARO  5 MG/0.5ML Pen   Other Relevant Orders   Hemoglobin A1c   Microalbumin / creatinine urine ratio   Other Visit Diagnoses       Annual physical exam    -  Primary   Relevant Orders   Lipid panel   Hemoglobin A1c   CBC with Differential/Platelet   PSA   Microalbumin / creatinine urine ratio   TSH   Comprehensive metabolic panel with GFR     Flu vaccine need       Relevant Orders   Flu vaccine trivalent PF, 6mos and older(Flulaval,Afluria,Fluarix,Fluzone) (Completed)     Screening for HIV (human immunodeficiency virus)       Relevant Orders   HIV Antibody (routine testing w rflx)     Screening examination for STD (sexually  transmitted disease)       Relevant Orders   HIV Antibody (routine testing w rflx)   RPR   GC/Chlamydia probe amp ()not at [facility]     Long-term current use of injectable noninsulin antidiabetic medication       Relevant Orders   Hemoglobin A1c     Entrapment of left ulnar Hurst       Relevant Medications   baclofen  (LIORESAL ) 10  MG tablet     Tinea versicolor       Relevant Medications   COVID-19 mRNA vaccine, Pfizer, (COMIRNATY) syringe   ketoconazole  (NIZORAL ) 2 % shampoo        Updated Health Maintenance information Fasting labs today pending Encouraged improvement to lifestyle with diet and exercise Goal of weight loss   Adult Wellness Visit Annual wellness visit conducted. - Order blood work including STD panel, uric acid, kidney, liver, cholesterol, and glucose tests. - Order urine test for protein, kidney function, gonorrhea, and chlamydia. - Administer influenza vaccine today - Discuss COVID-19 booster eligibility and order if desired. Send Rx to pharmacy - Discuss pneumonia and shingles vaccines. Recommend at pharmacy Prevnar-20 and Shingrix when ready  Type 2 diabetes mellitus Well controlled on GLP1/GIP Mounjaro  now on lower doses - Continue Mounjaro  5 mg for one month, then consider increasing to 7.5 mg. Has at home - Send Mounjaro  prescription to [facility]. Due to availability - DM Foot check - UTD Eye check - Order Microalbumin - Discuss COVID-19 booster and order if desired.  Gout Recent gout flare starting yesterday. Check uric acid - Refill colchicine  with instructions to take two tablets at onset of symptoms, one tablet in two hours, then one daily for 7-10 days.  Abdominal aortic dilation /ectasia Abdominal aortic dilation identified in 2021, below aneurysm range. At 2.8 cm Repeat every 5 years Aorta US  - Schedule follow-up aortic ultrasound in 2026.  Erectile dysfunction Erectile dysfunction managed with sildenafil . 20-60mg  per dose -  Refill sildenafil  with 100 tablets and a couple of refills. - Send sildenafil  prescription to [facility].  Seborrheic dermatitis Seborrheic dermatitis managed with ketoconazole  shampoo and gel for dry scalp. - Refill ketoconazole  shampoo and gel.  Muscle spasm of back Occasional muscle spasms managed with baclofen  as needed. - Refill baclofen  prescription.         Orders Placed This Encounter  Procedures   Flu vaccine trivalent PF, 6mos and older(Flulaval,Afluria,Fluarix,Fluzone)   Lipid panel    Has the patient fasted?:   Yes   Hemoglobin A1c   CBC with Differential/Platelet   PSA   Microalbumin / creatinine urine ratio   TSH   Comprehensive metabolic panel with GFR    Has the patient fasted?:   Yes   HIV Antibody (routine testing w rflx)   RPR   Uric acid    Meds ordered this encounter  Medications   DISCONTD: MOUNJARO  5 MG/0.5ML Pen    Sig: Inject 5 mg into the skin once a week.    Dispense:  2 mL    Refill:  2    Dose reduction from 7.5 to 5mg  due to side effect   COVID-19 mRNA vaccine, Pfizer, (COMIRNATY) syringe    Sig: Inject 0.3 mLs into the muscle once for 1 dose.    Dispense:  0.3 mL    Refill:  0    Approved at provider discretion. Product selection permitted.   baclofen  (LIORESAL ) 10 MG tablet    Sig: Take 0.5-1 tablets (5-10 mg total) by mouth 3 (three) times daily as needed for muscle spasms.    Dispense:  30 each    Refill:  1   colchicine  0.6 MG tablet    Sig: TAKE 2 TABLETS BY MOUTH IF GOUT FLARE, MAY REPEAT 1 TABLET IN 2 HOURS, THEN 1 TABLET EVERY DAY FOR UP TO 7 TO 10 DAYS    Dispense:  30 tablet    Refill:  3   ketoconazole  (NIZORAL )  2 % shampoo    Sig: Apply 1 Application topically daily as needed for irritation. Up to 1 week as needed for flare    Dispense:  120 mL    Refill:  1   sildenafil  (REVATIO ) 20 MG tablet    Sig: Take 1-3 tablets (20-60 mg total) by mouth as needed (erectile dysfunction).    Dispense:  100 tablet    Refill:  2    MOUNJARO  5 MG/0.5ML Pen    Sig: Inject 5 mg into the skin once a week.    Dispense:  2 mL    Refill:  2     Follow up plan: Return for 6 month DM A1c.  Marsa Officer, DO [facility] 07/05/2024, 8:16 AM

## 2024-07-05 NOTE — Progress Notes (Signed)
 [id number]

## 2024-07-06 LAB — CBC WITH DIFFERENTIAL/PLATELET
Absolute Lymphocytes: 1718 {cells}/uL (ref 850–3900)
Absolute Monocytes: 662 {cells}/uL (ref 200–950)
Basophils Absolute: 48 {cells}/uL (ref 0–200)
Basophils Relative: 0.7 %
Eosinophils Absolute: 83 {cells}/uL (ref 15–500)
Eosinophils Relative: 1.2 %
HCT: 52 % — ABNORMAL HIGH (ref 38.5–50.0)
Hemoglobin: 17.6 g/dL — ABNORMAL HIGH (ref 13.2–17.1)
MCH: 33 pg (ref 27.0–33.0)
MCHC: 33.8 g/dL (ref 32.0–36.0)
MCV: 97.4 fL (ref 80.0–100.0)
MPV: 10.6 fL (ref 7.5–12.5)
Monocytes Relative: 9.6 %
Neutro Abs: 4388 {cells}/uL (ref 1500–7800)
Neutrophils Relative %: 63.6 %
Platelets: 317 Thousand/uL (ref 140–400)
RBC: 5.34 Million/uL (ref 4.20–5.80)
RDW: 12.5 % (ref 11.0–15.0)
Total Lymphocyte: 24.9 %
WBC: 6.9 Thousand/uL (ref 3.8–10.8)

## 2024-07-06 LAB — COMPREHENSIVE METABOLIC PANEL WITH GFR
AG Ratio: 1.8 (calc) (ref 1.0–2.5)
ALT: 21 U/L (ref 9–46)
AST: 20 U/L (ref 10–35)
Albumin: 4.6 g/dL (ref 3.6–5.1)
Alkaline phosphatase (APISO): 55 U/L (ref 35–144)
BUN: 14 mg/dL (ref 7–25)
CO2: 28 mmol/L (ref 20–32)
Calcium: 9.4 mg/dL (ref 8.6–10.3)
Chloride: 104 mmol/L (ref 98–110)
Creat: 0.91 mg/dL (ref 0.70–1.35)
Globulin: 2.6 g/dL (ref 1.9–3.7)
Glucose, Bld: 103 mg/dL — ABNORMAL HIGH (ref 65–99)
Potassium: 4.7 mmol/L (ref 3.5–5.3)
Sodium: 141 mmol/L (ref 135–146)
Total Bilirubin: 0.7 mg/dL (ref 0.2–1.2)
Total Protein: 7.2 g/dL (ref 6.1–8.1)
eGFR: 95 mL/min/1.73m2 (ref >=60)

## 2024-07-06 LAB — RPR: RPR Ser Ql: NONREACTIVE

## 2024-07-06 LAB — LIPID PANEL
Cholesterol: 210 mg/dL — ABNORMAL HIGH (ref <200)
HDL: 50 mg/dL (ref >=40)
LDL Cholesterol (Calc): 130 mg/dL — ABNORMAL HIGH
Non-HDL Cholesterol (Calc): 160 mg/dL — ABNORMAL HIGH (ref <130)
Total CHOL/HDL Ratio: 4.2 (calc) (ref <5.0)
Triglycerides: 163 mg/dL — ABNORMAL HIGH (ref <150)

## 2024-07-06 LAB — HEMOGLOBIN A1C
Hgb A1c MFr Bld: 5.7 % — ABNORMAL HIGH (ref <5.7)
Mean Plasma Glucose: 117 mg/dL
eAG (mmol/L): 6.5 mmol/L

## 2024-07-06 LAB — GC/CHLAMYDIA PROBE AMP (~~LOC~~) NOT AT ARMC
Chlamydia: NEGATIVE
Comment: NEGATIVE
Comment: NORMAL
Neisseria Gonorrhea: NEGATIVE

## 2024-07-06 LAB — HIV ANTIBODY (ROUTINE TESTING W REFLEX)
HIV 1&2 Ab, 4th Generation: NONREACTIVE
HIV FINAL INTERPRETATION: NEGATIVE

## 2024-07-06 LAB — MICROALBUMIN / CREATININE URINE RATIO
Creatinine, Urine: 176 mg/dL (ref 20–320)
Microalb Creat Ratio: 9 mg/g{creat} (ref <30)
Microalb, Ur: 1.5 mg/dL

## 2024-07-06 LAB — URIC ACID: Uric Acid, Serum: 8.5 mg/dL — ABNORMAL HIGH (ref 4.0–8.0)

## 2024-07-06 LAB — TSH: TSH: 2.91 m[IU]/L (ref 0.40–4.50)

## 2024-07-06 LAB — PSA: PSA: 0.7 ng/mL (ref <=4.00)

## 2024-07-08 ENCOUNTER — Ambulatory Visit: Payer: Self-pay | Admitting: Family Medicine

## 2024-07-13 DIAGNOSIS — L821 Other seborrheic keratosis: Secondary | ICD-10-CM | POA: Diagnosis not present

## 2024-07-13 DIAGNOSIS — L578 Other skin changes due to chronic exposure to nonionizing radiation: Secondary | ICD-10-CM | POA: Diagnosis not present

## 2024-07-13 DIAGNOSIS — L814 Other melanin hyperpigmentation: Secondary | ICD-10-CM | POA: Diagnosis not present

## 2024-07-13 DIAGNOSIS — D1801 Hemangioma of skin and subcutaneous tissue: Secondary | ICD-10-CM | POA: Diagnosis not present

## 2024-07-19 NOTE — Progress Notes (Signed)
 Alec Hurst                                          MRN: [id number]   07/19/2024   The VBCI Quality Team Specialist reviewed this patient medical record for the purposes of chart review for care gap closure. The following were reviewed: abstraction for care gap closure-glycemic status assessment.    VBCI Quality Team

## 2024-08-18 ENCOUNTER — Telehealth: Payer: Self-pay | Admitting: Pharmacy Technician

## 2024-08-18 ENCOUNTER — Other Ambulatory Visit (HOSPITAL_COMMUNITY): Payer: Self-pay

## 2024-08-18 NOTE — Telephone Encounter (Signed)
 Pharmacy Patient Advocate Encounter  Received notification from [facility] that Prior Authorization for Mounjaro  5MG /0.5ML auto-injectors has been APPROVED from 07/19/2024 to 08/18/2025. Ran test claim, Copay is $25.00. This test claim was processed through [employer]- copay amounts may vary at other pharmacies due to pharmacy/plan contracts, or as the patient moves through the different stages of their insurance plan.   PA #/Case ID/Reference #: [id number]

## 2024-08-23 ENCOUNTER — Other Ambulatory Visit (HOSPITAL_COMMUNITY): Payer: Self-pay

## 2024-09-30 ENCOUNTER — Other Ambulatory Visit: Payer: Self-pay | Admitting: Family Medicine

## 2024-09-30 DIAGNOSIS — F410 Panic disorder [episodic paroxysmal anxiety] without agoraphobia: Secondary | ICD-10-CM

## 2024-09-30 NOTE — Telephone Encounter (Unsigned)
 Copied from CRM #[id number]. Topic: Clinical - Medication Refill >> Sep 30, 2024  9:33 AM Kendralyn S wrote: Medication: propranolol  (INDERAL ) 10 MG tablet  Has the patient contacted their pharmacy? Yes (Agent: If no, request that the patient contact the pharmacy for the refill. If patient does not wish to contact the pharmacy document the reason why and proceed with request.) (Agent: If yes, when and what did the pharmacy advise?)  This is the patient's preferred pharmacy:  [id number] - [place], [place] - [address] AT [facility] OF [address] [place] [address] Phone: [phone] Fax: [phone]  Is this the correct pharmacy for this prescription? Yes If no, delete pharmacy and type the correct one.   Has the prescription been filled recently? No  Is the patient out of the medication? Yes  Has the patient been seen for an appointment in the last year OR does the patient have an upcoming appointment? Yes  Can we respond through MyChart? yes  Agent: Please be advised that Rx refills may take up to 3 business days. We ask that you follow-up with your pharmacy.

## 2024-10-01 MED ORDER — PROPRANOLOL HCL 10 MG PO TABS: 10.0000 mg | ORAL_TABLET | Freq: Every day | ORAL | 3 refills | Status: AC | PRN

## 2024-10-01 NOTE — Telephone Encounter (Signed)
 Requested medication (s) are due for refill today: yes  Requested medication (s) are on the active medication list: no  Last refill:  07/01/2018  Future visit scheduled: yes  Notes to clinic:  historical medication     Requested Prescriptions  Pending Prescriptions Disp Refills   propranolol  (INDERAL ) 10 MG tablet 30 tablet 3    Sig: Take 1 tablet (10 mg total) by mouth daily as needed (performance anxiety, panic).     Cardiovascular:  Beta Blockers Passed - 10/01/2024  4:02 PM      Passed - Last BP in normal range    BP Readings from Last 1 Encounters:  07/05/24 132/84         Passed - Last Heart Rate in normal range    Pulse Readings from Last 1 Encounters:  07/05/24 66         Passed - Valid encounter within last 6 months    Recent Outpatient Visits           2 months ago Annual physical exam   [place] [facility] [place], Marsa PARAS, DO   8 months ago Type 2 diabetes mellitus with other specified complication, without long-term current use of insulin [facility])   [place] [facility], Marsa PARAS, DO   10 months ago Type 2 diabetes mellitus with other specified complication, without long-term current use of insulin [facility])   [place] [facility] [place], Marsa PARAS, [place]

## 2024-10-22 ENCOUNTER — Telehealth: Payer: Self-pay

## 2024-10-22 ENCOUNTER — Encounter: Payer: Self-pay | Admitting: Family Medicine

## 2024-10-22 NOTE — Telephone Encounter (Signed)
 Copied from CRM [id number]. 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 For [place] to please call pt at [phone]. >> Oct 22, 2024  1:57 PM Amy B wrote: 3rd attempt:  Patient called trying to reach [place] regarding form.  Please call [phone] or [phone]

## 2024-10-22 NOTE — Telephone Encounter (Signed)
Please refer to previous message

## 2024-10-22 NOTE — Telephone Encounter (Signed)
 Copied from CRM [id number]. Topic: General - Other >> Oct 22, 2024 12:31 PM Zebedee SAUNDERS wrote: Reason for CRM: Pt stated that he needs form completed by Dr. 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

## 2024-10-22 NOTE — Telephone Encounter (Signed)
 This will need to wait until PCP return.  In addition to his concern about the M[place]jaro , he also has a history of atrial fibrillation and I believe with these forms that they have to be cleared by cardiology for deep sea diving.

## 2024-10-22 NOTE — Telephone Encounter (Signed)
 Copied from CRM [id number]. 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 For Erminio to please call pt at [phone].

## 2024-10-22 NOTE — Telephone Encounter (Signed)
 Spoke with patient, explained to him these forms need to be completed by his PCP. He is rescheduling his dive. Explained to him this should be pushed out until Tuesday to Wednesday in order to get forms completed.

## 2024-10-25 NOTE — Telephone Encounter (Signed)
 Reviewed this case and the form and his history, he has been cleared by cardiology for no further intervention on AFib, he is s/p ablation and remains in sinus rhythm, no longer on anticoagulation, or anti arrhythmia medication. I would be comfortable providing medical clearance for his form, he should not require additional cardiac clearance at this time.  Marsa Officer, DO [facility] 10/25/2024, 10:10 AM

## 2025-01-03 ENCOUNTER — Ambulatory Visit: Admitting: Family Medicine
